# Patient Record
Sex: Female | Born: 1980 | Race: White | Hispanic: No | Marital: Married | Smoking: Never smoker
Health system: Southern US, Community
[De-identification: ages and names within clinical notes are randomized; demographics above are authoritative.]

## PROBLEM LIST (undated history)

## (undated) DIAGNOSIS — I493 Ventricular premature depolarization: Secondary | ICD-10-CM

## (undated) DIAGNOSIS — N3020 Other chronic cystitis without hematuria: Secondary | ICD-10-CM

## (undated) DIAGNOSIS — G43909 Migraine, unspecified, not intractable, without status migrainosus: Secondary | ICD-10-CM

## (undated) DIAGNOSIS — R002 Palpitations: Secondary | ICD-10-CM

## (undated) DIAGNOSIS — N390 Urinary tract infection, site not specified: Secondary | ICD-10-CM

## (undated) DIAGNOSIS — K219 Gastro-esophageal reflux disease without esophagitis: Secondary | ICD-10-CM

## (undated) DIAGNOSIS — F410 Panic disorder [episodic paroxysmal anxiety] without agoraphobia: Secondary | ICD-10-CM

## (undated) DIAGNOSIS — Z87898 Personal history of other specified conditions: Secondary | ICD-10-CM

## (undated) HISTORY — PX: TUBAL LIGATION: SHX77

## (undated) HISTORY — PX: TONSILLECTOMY: SUR1361

## (undated) HISTORY — PX: BLADDER SURGERY: SHX569

## (undated) HISTORY — PX: LUMBAR FUSION: SHX111

---

## 2005-05-07 ENCOUNTER — Ambulatory Visit: Payer: Self-pay | Admitting: Otolaryngology

## 2006-01-14 ENCOUNTER — Observation Stay: Payer: Self-pay | Admitting: Obstetrics & Gynecology

## 2006-01-15 ENCOUNTER — Observation Stay: Payer: Self-pay

## 2006-03-08 ENCOUNTER — Observation Stay: Payer: Self-pay

## 2006-03-30 ENCOUNTER — Inpatient Hospital Stay: Payer: Self-pay

## 2008-05-16 ENCOUNTER — Emergency Department: Payer: Self-pay | Admitting: Emergency Medicine

## 2008-09-13 ENCOUNTER — Emergency Department: Payer: Self-pay | Admitting: Emergency Medicine

## 2009-01-30 ENCOUNTER — Ambulatory Visit: Payer: Self-pay | Admitting: Unknown Physician Specialty

## 2009-04-14 HISTORY — PX: SPINAL FUSION: SHX223

## 2009-05-02 ENCOUNTER — Emergency Department: Payer: Self-pay | Admitting: Emergency Medicine

## 2009-08-09 ENCOUNTER — Ambulatory Visit: Payer: Self-pay | Admitting: Unknown Physician Specialty

## 2009-10-04 ENCOUNTER — Encounter: Admission: RE | Admit: 2009-10-04 | Discharge: 2009-10-04 | Payer: Self-pay | Admitting: Unknown Physician Specialty

## 2009-11-29 ENCOUNTER — Ambulatory Visit: Payer: Self-pay | Admitting: Unknown Physician Specialty

## 2009-12-04 ENCOUNTER — Inpatient Hospital Stay: Payer: Self-pay | Admitting: Unknown Physician Specialty

## 2011-07-16 ENCOUNTER — Ambulatory Visit: Payer: Self-pay

## 2011-09-29 ENCOUNTER — Ambulatory Visit: Payer: Self-pay

## 2011-09-29 LAB — HEMATOCRIT: HCT: 41.9 %

## 2011-09-29 LAB — PREGNANCY, URINE: Pregnancy Test, Urine: NEGATIVE m[IU]/mL

## 2011-10-03 ENCOUNTER — Ambulatory Visit: Payer: Self-pay

## 2011-12-13 ENCOUNTER — Emergency Department: Payer: Self-pay | Admitting: Emergency Medicine

## 2011-12-15 ENCOUNTER — Emergency Department: Payer: Self-pay | Admitting: Emergency Medicine

## 2011-12-20 ENCOUNTER — Emergency Department: Payer: Self-pay | Admitting: Emergency Medicine

## 2011-12-26 ENCOUNTER — Observation Stay: Payer: Self-pay | Admitting: Internal Medicine

## 2011-12-26 LAB — CSF CELL CT + PROT + GLU PANEL
CSF Tube #: 3
Lymphocytes: 77 %
Neutrophils: 3 %
Other Cells: 0 %
RBC (CSF): 0 /mm3
WBC (CSF): 12 /mm3

## 2011-12-26 LAB — COMPREHENSIVE METABOLIC PANEL
Alkaline Phosphatase: 64 U/L (ref 50–136)
Calcium, Total: 9.4 mg/dL (ref 8.5–10.1)
Chloride: 105 mmol/L (ref 98–107)
Co2: 27 mmol/L (ref 21–32)
EGFR (African American): >60
EGFR (Non-African Amer.): >60
SGPT (ALT): 21 U/L (ref 12–78)

## 2011-12-26 LAB — CBC WITH DIFFERENTIAL/PLATELET
Basophil #: 0 10*3/uL (ref 0.0–0.1)
Eosinophil #: 0 10*3/uL (ref 0.0–0.7)
Lymphocyte %: 7.2 %
MCHC: 33.4 g/dL (ref 32.0–36.0)
Neutrophil #: 6.1 10*3/uL (ref 1.4–6.5)
Neutrophil %: 89.1 %
Platelet: 156 10*3/uL (ref 150–440)
RDW: 12.1 % (ref 11.5–14.5)

## 2011-12-26 LAB — URINALYSIS, COMPLETE
Bacteria: NONE SEEN
Glucose,UR: NEGATIVE mg/dL (ref 0–75)
Squamous Epithelial: 7

## 2011-12-27 LAB — CBC WITH DIFFERENTIAL/PLATELET
Basophil #: 0 10*3/uL (ref 0.0–0.1)
Basophil %: 0.6 %
Eosinophil #: 0.1 10*3/uL (ref 0.0–0.7)
Eosinophil %: 2.6 %
HCT: 33.4 % — ABNORMAL LOW (ref 35.0–47.0)
HGB: 11.3 g/dL — ABNORMAL LOW (ref 12.0–16.0)
Lymphocyte #: 1.3 10*3/uL (ref 1.0–3.6)
Lymphocyte %: 38.5 %
MCH: 30.9 pg (ref 26.0–34.0)
MCHC: 33.8 g/dL (ref 32.0–36.0)
MCV: 91 fL (ref 80–100)
Monocyte #: 0.4 x10 3/mm (ref 0.2–0.9)
Monocyte %: 11.6 %
Neutrophil #: 1.6 10*3/uL (ref 1.4–6.5)
Neutrophil %: 46.7 %
Platelet: 124 10*3/uL — ABNORMAL LOW (ref 150–440)
RBC: 3.66 10*6/uL — ABNORMAL LOW (ref 3.80–5.20)
RDW: 12.3 % (ref 11.5–14.5)
WBC: 3.5 10*3/uL — ABNORMAL LOW (ref 3.6–11.0)

## 2011-12-27 LAB — COMPREHENSIVE METABOLIC PANEL
Albumin: 2.5 g/dL — ABNORMAL LOW (ref 3.4–5.0)
Alkaline Phosphatase: 47 U/L — ABNORMAL LOW (ref 50–136)
Anion Gap: 8 (ref 7–16)
BUN: 10 mg/dL (ref 7–18)
Bilirubin,Total: 0.2 mg/dL (ref 0.2–1.0)
Calcium, Total: 7.9 mg/dL — ABNORMAL LOW (ref 8.5–10.1)
Chloride: 111 mmol/L — ABNORMAL HIGH (ref 98–107)
Co2: 24 mmol/L (ref 21–32)
Creatinine: 0.78 mg/dL (ref 0.60–1.30)
EGFR (African American): >60
EGFR (Non-African Amer.): >60
Glucose: 68 mg/dL (ref 65–99)
Osmolality: 282 (ref 275–301)
Potassium: 4 mmol/L (ref 3.5–5.1)
SGOT(AST): 16 U/L (ref 15–37)
SGPT (ALT): 47 U/L (ref 12–78)
Sodium: 143 mmol/L (ref 136–145)
Total Protein: 5.2 g/dL — ABNORMAL LOW (ref 6.4–8.2)

## 2011-12-27 LAB — URINE CULTURE

## 2011-12-28 LAB — CBC WITH DIFFERENTIAL/PLATELET
Basophil #: 0 10*3/uL (ref 0.0–0.1)
Eosinophil #: 0.1 10*3/uL (ref 0.0–0.7)
Eosinophil %: 2.6 %
HGB: 13 g/dL (ref 12.0–16.0)
Lymphocyte #: 1.3 10*3/uL (ref 1.0–3.6)
Lymphocyte %: 30.3 %
Monocyte #: 0.6 x10 3/mm (ref 0.2–0.9)
Monocyte %: 12.9 %
Neutrophil %: 53.4 %
Platelet: 160 10*3/uL (ref 150–440)
RBC: 4.14 10*6/uL (ref 3.80–5.20)
WBC: 4.3 10*3/uL (ref 3.6–11.0)

## 2012-03-02 ENCOUNTER — Ambulatory Visit: Payer: Self-pay | Admitting: Neurology

## 2012-09-07 DIAGNOSIS — N3020 Other chronic cystitis without hematuria: Secondary | ICD-10-CM | POA: Insufficient documentation

## 2013-07-21 ENCOUNTER — Ambulatory Visit: Payer: Self-pay

## 2014-07-26 DIAGNOSIS — F419 Anxiety disorder, unspecified: Secondary | ICD-10-CM | POA: Insufficient documentation

## 2014-07-26 DIAGNOSIS — G43909 Migraine, unspecified, not intractable, without status migrainosus: Secondary | ICD-10-CM | POA: Insufficient documentation

## 2014-08-01 NOTE — Consult Note (Signed)
PATIENT NAME:  Nicole, Yu MR#:  [id number] DATE OF BIRTH:  Oct 09, 1980  DATE OF CONSULTATION:  12/26/2011  REFERRING PHYSICIAN:  Ceasar Lund. Anselm Jungling, MD   CONSULTING PHYSICIAN:  Heinz Knuckles. Jayce Kainz, MD  REASON FOR CONSULTATION: Fever and headache.   HISTORY OF PRESENT ILLNESS: The patient is a 34 year old white female with a past history significant for lumbar spine surgery who was admitted today with approximately 4 to 5 days of headache, photophobia, and worsening fever. The patient was in her usual state of health until the Sunday before admission when she began having headache. She took over-the-counter medications. She describes most of the headache as being somewhat frontal. She thought it might be involving her sinuses, so in addition to Tylenol she was taking over-the-counter decongestants with some relief. On the day prior to admission, she began having increased headache and photophobia. She also began having high spiking temperatures and chills and sweats. She also had some stiff neck. She was brought to the hospital last night and was subsequently admitted early this morning. She was given a dose of doxycycline in the ER but has not received any other antibiotics since that time. She had a normal white count on admission. She underwent lumbar puncture today which had 0 red cells, 12 white cells, 77% of which were lymphocytes. She denies any rashes. She has not had any respiratory symptoms. She has had some intermittent loose stool which is not atypical for her. She does report having had several ticks removed from her on the day of the onset of her symptoms. She was out tracking a deer with her husband. She does not recall other tick exposures in the last six months but is outside often and works with animals. 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 The dog was given vaccines and later developed neurologic symptoms. He  was brought back to the vet, and during one of these visits the patient was scratched but not bitten by the dog. When the dog began having worsening neurologic symptoms, it was given treatment for a vaccine reaction. Over the next few days, the dog was shot and the left at the side of the road. This was not learned by that veterinarian office until  two days later, and at that point the dog was not able to be evaluated for the possibility of rabies. This exposure occurred 12/09/2011. The patient received immune globulin and rabies vaccine and was scheduled to receive her last rabies vaccine tomorrow.   ALLERGIES: Amoxicillin, Mobic, penicillin, eggs and doxycycline (which she states causes stomach upset although she tolerated the dose yesterday without problem).   PAST MEDICAL HISTORY:  1. 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  2. Panic attack. This occurred once over a year ago and has  not recurred.  3. L4-L5 fusion.  4. Bilateral tubal ligation.   SOCIAL HISTORY: The patient works in a Chief Technology Officer. She is exposed to multiple animals. She does not smoke. She drinks very rarely. She lives with her husband and two children. She does hunt.   FAMILY HISTORY: Positive for coronary artery disease, breast cancer, and ankylosing spondylitis.    REVIEW OF SYSTEMS: GENERAL: Positive fevers, chills, sweats, malaise. HEENT: Positive headache that has been fairly severe. Positive photophobia. Some sinus congestion. No sore throat. NECK: Positive stiffness. No swollen glands. RESPIRATORY: No cough, no shortness of breath. No sputum production. CARDIAC:  No chest pains or palpitations. GI: No nausea, no vomiting, no abdominal pain. She has had some loose stool occasionally. GENITOURINARY: No change in her urine. MUSCULOSKELETAL: She has had myalgias but no frank arthritis. SKIN: No rashes. NEUROLOGIC: No focal weakness. No agitation. No somnolence. PSYCHIATRIC: No complaints. All  other systems are negative.   PHYSICAL EXAMINATION:  VITAL SIGNS: T-max 97.9 in the hospital, although it is reported in the History and Physical that she had a fever of up to 102.8 in the Emergency Room. T-current is 96.8, pulse 80, blood pressure 106/68, 96% on room air.   GENERAL: A 34 year old white female in no acute distress but uncomfortable in a darkened room.   HEENT: Normocephalic, atraumatic. Pupils are equal, reactive to light. Extraocular motion intact. Sclerae, conjunctivae, and lids are without evidence for emboli or petechiae. Oropharynx shows no erythema or exudate. Teeth and gums are in good condition. Sinuses are without focal tenderness.   NECK: Midline trachea. No lymphadenopathy. No thyromegaly. She had neck stiffness and had discomfort with moving her neck into forward flexion.   CHEST: Clear to auscultation bilaterally with good air movement. No focal consolidation.  HEART: Regular rate and rhythm without murmur, rub, or gallop.   ABDOMEN: Soft, nontender, and nondistended. No hepatosplenomegaly. No hernia is noted.   EXTREMITIES: No evidence for tenosynovitis.   SKIN: No rashes. No stigmata of endocarditis, specifically no Janeway lesions nor Osler nodes.   NEUROLOGIC: The patient was awake and interactive. She was mentating well. She appeared uncomfortable but was not lethargic. She was able to provide a good history. She was moving all four extremities. She did have neck stiffness as well as evident photophobia when the lights were turned on. She had positive Kernig and Brudzinski sign present.   PSYCHIATRIC: Mood and affect appeared normal.   LABORATORY, DIAGNOSTIC AND RADIOLOGICAL DATA: BUN 12, creatinine 0.86, bicarbonate 27, anion gap of 9.0. LFTs were within normal limits. White count 6.8, hemoglobin 13.5, platelet count 156, ANC 6.1 CSF tube three showed 0 red cells, 12 white cells, 3% neutrophils, 77% lymphocytes, glucose was 59, protein was 30, rapid  influenza testing was negative. CSF Gram stain shows rare white cells, no red cells, no organisms seen. Cultures are pending. Urinalysis was unremarkable other than 1+ blood, lactic acid level was 0.8. A chest x-ray showed no acute infiltrates. A CT scan of the head without contrast showed no acute intracranial process.   IMPRESSION: A 34 year old white female with a history of lumbar back surgery admitted with fever, headaches, and photophobia.   RECOMMENDATIONS:  1. She has some meningeal signs but her white blood cells and her CSF are only mildly elevated. She has had tick exposure and RMSF or Ehrlichia are possible etiologies. There is no confusion to suggest encephalitis. She had an exposure to a dog who subsequently developed neurologic symptoms but was not able to be evaluated for rabies. Her exposure was a scratch rather than a bright, and she has been given rabies prophylaxis. Her symptoms are not currently suggestive of human rabies.  2. She has received one dose of doxycycline in the ER but has not received any more. She appeared to tolerate this well. We will start her on a seven-day course of therapy. If she develops GI side effects, would treat the symptoms and continue the doxycycline.  3. We will add an RMSF IgG to the current labs already pending. Several other serologies for Ehrlichia and RMSF are pending.  4. If she improves,  I would DC her on doxycycline, and I will be happy to see her next week in the office.  5. If her headache persists but her other symptoms improve, I would consider the possibility of a CSF leak.  6. If she becomes more somnolent or significantly agitated, we will need to consider rabies more closely and perform a nuchal fold biopsy and salivary PCR for rabies virus. No specific therapy would be available.   This is a highly complex Infectious Disease consult.   Thank you very much for involving me in Nicole Yu care.   ____________________________ Heinz Knuckles. Chetan Mehring, MD meb:cbb D: 12/26/2011 16:24:24 ET T: 12/26/2011 17:00:12 ET JOB#: [id number]  cc: Heinz Knuckles. Kala Ambriz, MD, <Dictator> Purl Claytor E Costella Schwarz MD ELECTRONICALLY SIGNED 12/29/2011 8:38

## 2014-08-01 NOTE — Consult Note (Signed)
Impression: 34yo WF w/ h/o lumbar back surgery admitted with fever, headache and photophobia.  She has some meningeal signs, but her WBCs in her CSF are only mildly elevated.  She has had tick exposure and RMSF or Ehrlichia are possible etiologies.  There no confusion to suggest encephalitis.  She had an exposure to a dog who subsequently developed neurologic symptoms, but was not able to be evaluated for rabies.  Her exposure was a scratch rather than a bite and she has been given rabies prophylaxis.  Her symptoms are not currently suggestive of human rabies. She received one dose of doxycyline in the ER, but has not received any more.  Will start her on a 7 day course of therapy. Will add RMSF IgG to her labs.  Other serologies are pending. If she improves, would d/c her on doxycycline and I will see her next week in the office. If her headache persists, but her other symptoms improve, will consider possibility of a CSF leak. If she becomes more somnolent or significantly agitated, will need to consider rabies more closely and perform a nuchal fold biopsy and salivary PCR.  No specific therapy would be available.  Electronic Signatures: Kanetra Ho, Heinz Knuckles (MD)  (Signed on 13-Sep-13 16:04)  Authored  Last Updated: 13-Sep-13 16:04 by Amed Datta, Heinz Knuckles (MD)

## 2014-08-01 NOTE — Discharge Summary (Signed)
PATIENT NAME:  Nicole Yu, Nicole Yu MR#:  [id number] DATE OF BIRTH:  1980/09/20  DATE OF ADMISSION:  12/26/2011 DATE OF DISCHARGE:  12/28/2011  PRIMARY CARE PHYSICIAN: Maurine Minister, NP  CONSULTATION: Dr. Clayborn Bigness   FINAL DIAGNOSIS: Fever of unknown origin, possible viral meningitis.   CONDITION: Stable.   CODE STATUS: FULL CODE.   HOME MEDICATIONS:  1. Norco 325 mg/10 mg p.o. tablets every six hours p.r.n. for three days.  2. Doxycycline 100 mg p.o. daily q.12 hours for five days.   3. Zofran 4 mg p.o. every eight hours for five days p.r.n.  4. Stop medication: Tramadol 50 mg p.o. q.4 hours p.r.n.    DIET: Regular diet.   ACTIVITY: As tolerated.   FOLLOW UP CARE: Follow up PCP within one week. Follow up with Dr. Clayborn Bigness within one week.   REASON FOR ADMISSION: Headache and fever.   HISTORY OF PRESENT ILLNESS: Patient is a 34 year old Caucasian female with history of recurrent urinary tract infections developed sudden onset of headache which was very severe, 9/10. In addition patient complains of neck pain and photophobia. Patient was noted to have fever of 102.8 in the hospital. She has history of going out trying to catch a deer and removed two ticks from her leg. In addition, patient has a history of being scratched by a dog at her work. She works in an Technical brewer for mostly pets. For history and physical examination please refer to the admission note dictated by Dr. Laurin Coder.   LABORATORY, DIAGNOSTIC AND RADIOLOGICAL DATA: Laboratory data on admission glucose 111, BUN 12, creatinine 0.86, potassium 3.8. Liver functions normal, white count 6.8, hemoglobin 13.5, platelets 156. Urinalysis negative. Lactic acid 0.8.   HOSPITAL COURSE: Patient was admitted for fever of unknown origin and headache. Differential diagnosis includes ehrlichiosis, [facility] spotted fever, typhus, Lyme disease. After admission patient has been treated with pain control and doxycycline 100 mg p.o. every 12  hours. Patient underwent lumbar puncture, however, cerebrospinal fluid test is negative. According to Dr. Clayborn Bigness patient needs total seven days of doxycycline and follow up with him as outpatient. So far work-up is negative. Patient still has a headache but is better. No other symptoms. Physical examination is unremarkable. No focal deficit. Patient is clinically stable. Will be discharged home today. Discussed the patient's discharge plan with patient.   TIME SPENT: About 35 minutes.  ____________________________ Demetrios Loll, MD qc:cms D: 12/28/2011 14:10:42 ET T: 12/29/2011 09:07:15 ET JOB#: [id number]  cc: Demetrios Loll, MD, <Dictator> Meredith Leeds, NP Demetrios Loll MD ELECTRONICALLY SIGNED 12/29/2011 16:05

## 2014-08-01 NOTE — H&P (Signed)
PATIENT NAME:  Nicole Yu, Nicole Yu MR#:  [id number] DATE OF BIRTH:  Jan 08, 1981  DATE OF ADMISSION:  12/26/2011  PRIMARY CARE PHYSICIAN: Maurine Minister, NP  REASON FOR ADMISSION: Headache and fever.   HISTORY OF PRESENT ILLNESS: Nicole Yu is a very nice 34 year old female who is overall healthy other than having recurrent UTIs. She developed sudden onset of headache on Sunday which was very severe, 9 to 10 out of 10 and it has been continuous and not getting better since then. The patient states that the headache is located all around her head, diffuse, throbbing with intensity of 9 out of 10 for the most and aggravated by movement, loud sounds, and lights. It gets better in the dark and whenever she rests. This has also been associated with neck pain and some type of neck stiffness. For the past four to five days the headache has remained steady and it has not gotten any better. The patient states that she has never had migraine headaches or any other type of headaches and never felt this way before. Today she started developing nausea, arthralgias, and fever. Her fever was documented as 102.8 here in the hospital and decreased to 99.2 after medications like Tylenol were given. The patient states that the nausea has been significant through all day, but she has not had any vomiting. She also feels like her heart is palpitating really fast. She states that her neck and wrist feel very tender, but she has muscle like pains around the calf, thighs and lower back. The patient feels like she has been working out really hard and today she is sore after a workout. The patient had some Sudafed to help the headache and she states that she has been sneezing a lot. She has a history of going out to try to catch a deer last weekend and she removed two ticks out of her legs, one on her left foot and the other one on her right posterior thigh. They were attached but she states that they were not attached for too long. She also  has a history of being scratched by a dog at her work. She works in an Technical brewer for mostly pets. The dog who scratched her was suspicions for rabies and the dog was not put in quarantine and apparently was exhibiting some neurologic symptoms, but the dog died and was never taken for testing. The patient has received rabies prophylaxis treatment and she has gotten the third dose of injection so far.   REVIEW OF SYSTEMS: CONSTITUTIONAL: Positive fever, positive fatigue, positive weakness, positive headache and positive neck pain. No weight loss. No weight gain. EYES: Positive photophobia and some pain behind the eyes due to the headache. Denies any changes in vision, double vision or blurry vision. ENT: Denies any tinnitus. No sore throat. No difficulty swallowing. No postnasal drip. Positive sneezing. RESPIRATORY: No cough. No wheezing. No pneumonias in the past. No dyspnea. CARDIOVASCULAR: No chest pain, orthopnea, or syncope. GASTROINTESTINAL: Positive nausea. No abdominal pain and no hematemesis or melena. No rectal bleeding. No jaundice. GENITOURINARY: Denies any dysuria or hematuria. She does have chronic or recurrent UTIs, but she does not feel like she has one at this moment. She denies any sexually transmitted diseases. She has had two vaginal births. ENDOCRINE: No polyuria, polydipsia, polyphagia, cold or heat intolerance. HEME/LYMPH: Negative for bleeding or easy bruising. SKIN: Negative for rashes or petechiae. MUSCULOSKELETAL: Positive for myalgias and arthralgias. No edema of joints. NEUROLOGIC: Positive for severe headache.  No vertigo. No transient ischemic attacks. No seizures. PSYCH: Negative for bipolar disorder, insomnia, or depression.   PAST MEDICAL HISTORY: Recurrent urinary tract infections.  ALLERGIES: The patient is allergic to amoxicillin, penicillin, Mobic, and eggs. The amoxicillin and penicillin give her rash on the face, the Mobic nausea, and she has also listed doxycycline  as an allergy, although the only reaction she had is just nausea. She does not have a true allergy to doxycycline.   PAST SURGICAL HISTORY:  1. Bladder surgery as a child. 2. Tonsillectomy.  3. Back fusion at the level of L4-L5.  4. Bilateral tubal ligation.  5. Two vaginal deliveries and one miscarriage.   MEDICATIONS: Excedrin Headache and Sudafed.   SOCIAL HISTORY: She denies any tobacco use and never has. She drinks very seldom, once a week socially and she works in an Technical brewer and as mentioned above she has been exposed to a suspected rabid dog.  FAMILY HISTORY: Positive for myocardial infarction in grandfather and uncle and breast cancer in an aunt. No diabetes in the family.   PHYSICAL EXAMINATION:   VITAL SIGNS: Blood pressure 103/51 and pulse 116. On admission pulse was 160 and temperature was 102.8.   GENERAL: The patient is alert and oriented x3. She is in some mild distress due to her headache. She is lying down in a dark room with her eyes closed and a pillow on top of her head. She is not in any respiratory distress at the moment.   HEENT: Her pupils are equal and reactive to light. Extraocular movements are intact. Mucosa is moist. No oropharyngeal exudates. No oral lesions. No postnasal drip. No erythema of the oropharynx. Anicteric sclerae. Pink conjunctivae. Head normocephalic and atraumatic.   NECK: Supple. No JVD. No thyromegaly. No adenopathy. Mild tender to mobilization but Kernig and Brudzinski signs are negative. No carotid bruits. No palpable lymphadenopathy in the neck or supraclavicular areas.   CARDIOVASCULAR: Regular rate and rhythm. No murmurs, rubs, or gallops. Tachycardic. Apical cardiac impulse is nondisplaced.   LUNGS: Clear without any wheezing or crepitus. No dullness to percussion.   ABDOMEN: Soft, nontender, and nondistended. No hepatosplenomegaly. No masses. Bowel sounds are positive. No hepatic stigmata.   GENITALIA: Examination is  deferred.  EXTREMITIES: No edema, no cyanosis, and no clubbing. Normal articulation. No erythema on top of joints.   MUSCULOSKELETAL: No deformity of joints. No tenderness of spinous processes.   NEUROLOGIC: Cranial nerves II through XII are intact. Nonfocal exam.   SKIN: No rashes on the palms or soles. The patient has two insect/tick bite marks on her left foot and posterior thigh. No Target lesions. No erythema around these bites. No palpable nodules on these areas as well.  LYMPHATICS: Negative for lymphadenopathy in neck, supraclavicular, axillary, or epitrochlear.   PSYCH: Mood is normal without any signs of depression.  LABORATORY AND RADIOLOGIC DATA: Glucose 111, BUN 12, creatinine 0.86, and potassium 3.8. LFTs are within normal limits. White count 6.8, hemoglobin 13.5, and platelets 156.   Urinalysis: 4 white blood cells and negative for nitrites or leukocyte esterase.   Lactic acid is 0.8.   Chest x-ray is without any infiltrates or consolidations.   ASSESSMENT AND PLAN: A 34 year old female with history of headache for 4 or 5 days and development of fever for one day, significant nausea, arthralgias, and rash, possible tick born illness.   1. Fever of unknown origin. The patient is admitted with significant fever, no source is identified at the moment. As  mentioned above, negative chest x-ray and negative urinalysis. Blood cultures have been taken. White count is normal. No significant abnormalities on blood work, no liver problems. 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 Due to the suspicion of this, we are going to start treatment with doxycycline. The differential diagnosis includes Ehrlichiosis, [facility] Spotted Fever, and typhus. There is also always the possibility of Lyme disease, although the clinical evidence does not correlate much to it. But again she is going to be treated with doxycycline  anyway. The patient does not have a true allergy to doxycycline. Rickettsia panel and [facility] Spotted Fever panel have been sent as well as Ehrlichia immunology. I am going to add influenza A and B antigen to rule out the possibility of having the flu. We are going to consult Infectious Disease and at this moment, since the patient does not have any neurologic symptoms other than the headache and no Kernig or Brudzinski but significant neck pain, we are going to hold on lumbar puncture. The patient and I had a long talk that she might need one. The patient wants not to pursue this at this moment. The ER physician has already talked to her about the risks and benefits and also about the fact that it might not change any of the approach to treatment right now. We are going to continue to monitor her vitals and give support with IV fluids and medications for the headache and fever.  2. Tachycardia. Likely due to fever and mild dehydration.  3. Mild dehydration. Due to the fever. The patient is to receive IV fluids.  4. Headache. Continue treatment with Percocet and Dilaudid if needed.  5. Possible exposure to rabies. Not likely to be rabies due to fast progression, but the patient has received rabies prophylaxis already.    6. Deep vein thrombosis prophylaxis with TED hose.   TIME SPENT: I spent about 50 minutes with this patient. ____________________________ Carnegie Sink, MD rsg:slb D: 12/26/2011 07:14:00 ET T: 12/26/2011 09:26:17 ET JOB#: [id number]  cc: Francis Sink, MD, <Dictator> Meredith Leeds, NP Cristi Loron MD ELECTRONICALLY SIGNED 12/27/2011 15:16

## 2014-08-06 NOTE — Op Note (Signed)
PATIENT NAME:  Nicole Yu, Nicole Yu MR#:  [id number] DATE OF BIRTH:  1981-04-12  DATE OF PROCEDURE:  10/03/2011  PREOPERATIVE DIAGNOSES:  1. Desires sterilization.  2. Desires removal of intrauterine device.   POSTOPERATIVE DIAGNOSES:  1. Desires sterilization.  2. Desires removal of intrauterine device.   OPERATION PERFORMED: Removal of intrauterine device, laparoscopic tubal bilateral cautery and division.   SURGEON: Wonda Cheng. Laurey Morale, M.D.   OPERATIVE FINDINGS: Normal pelvis.   DESCRIPTION OF PROCEDURE: After adequate general anesthesia, the patient was prepped and draped in routine fashion. An infraumbilical incision was made through the skin and approximately 3 liters of carbon dioxide were insufflated without incident. During insufflation the IUD was removed. The uterine manipulator was placed and the bladder was drained. The laparoscope was inserted. Totally normal pelvis was appreciated. The right tube was grasped and after adequate identification of the right fimbria, the right tube was coagulated with bipolar forceps. Coagulation was continued until the current meter read zero. Several additional portions were cauterized on the right side.  A like procedure was performed on the other side. Both areas then were cut with the cutting forceps. All areas of surgery were inspected and found to be hemostatic. The liver was visualized and appeared normal. The appendix was visualized and appeared normal. CO2  was allowed to escape. The skin was closed in routine fashion. The patient tolerated the procedure well and left the operating room in good condition. Sponge and needle counts were said to be correct at the end of the procedure. Estimated blood loss was 2 mL.   ____________________________ Wonda Cheng. Laurey Morale, MD pjr:bjt D:  10/03/2011 10:30:26 ET         T: 10/03/2011 13:11:07 ET         JOB#: [id number]  [name] MD ELECTRONICALLY SIGNED 10/04/2011 6:24

## 2014-09-05 ENCOUNTER — Ambulatory Visit: Payer: No Typology Code available for payment source | Attending: Neurology

## 2014-09-05 DIAGNOSIS — G4711 Idiopathic hypersomnia with long sleep time: Secondary | ICD-10-CM | POA: Insufficient documentation

## 2014-09-05 DIAGNOSIS — R0683 Snoring: Secondary | ICD-10-CM | POA: Diagnosis present

## 2014-09-06 ENCOUNTER — Ambulatory Visit: Payer: No Typology Code available for payment source | Attending: Neurology

## 2014-09-06 DIAGNOSIS — G4711 Idiopathic hypersomnia with long sleep time: Secondary | ICD-10-CM | POA: Diagnosis not present

## 2015-05-07 DIAGNOSIS — N390 Urinary tract infection, site not specified: Secondary | ICD-10-CM | POA: Insufficient documentation

## 2015-05-07 DIAGNOSIS — R300 Dysuria: Secondary | ICD-10-CM | POA: Insufficient documentation

## 2015-06-28 DIAGNOSIS — N1370 Vesicoureteral-reflux, unspecified: Secondary | ICD-10-CM | POA: Insufficient documentation

## 2015-07-02 DIAGNOSIS — N3592 Unspecified urethral stricture, female: Secondary | ICD-10-CM | POA: Insufficient documentation

## 2016-04-24 ENCOUNTER — Ambulatory Visit
Admission: EM | Admit: 2016-04-24 | Discharge: 2016-04-24 | Disposition: A | Discharge status: Home / Self Care | Payer: Managed Care, Other (non HMO) | Attending: Family Medicine | Admitting: Family Medicine

## 2016-04-24 DIAGNOSIS — Z8661 Personal history of infections of the central nervous system: Secondary | ICD-10-CM | POA: Diagnosis not present

## 2016-04-24 DIAGNOSIS — R110 Nausea: Secondary | ICD-10-CM | POA: Diagnosis not present

## 2016-04-24 DIAGNOSIS — Z9889 Other specified postprocedural states: Secondary | ICD-10-CM | POA: Insufficient documentation

## 2016-04-24 DIAGNOSIS — R002 Palpitations: Secondary | ICD-10-CM | POA: Insufficient documentation

## 2016-04-24 DIAGNOSIS — Z79899 Other long term (current) drug therapy: Secondary | ICD-10-CM | POA: Diagnosis not present

## 2016-04-24 DIAGNOSIS — Z880 Allergy status to penicillin: Secondary | ICD-10-CM | POA: Insufficient documentation

## 2016-04-24 DIAGNOSIS — F410 Panic disorder [episodic paroxysmal anxiety] without agoraphobia: Secondary | ICD-10-CM | POA: Diagnosis not present

## 2016-04-24 DIAGNOSIS — Z981 Arthrodesis status: Secondary | ICD-10-CM | POA: Diagnosis not present

## 2016-04-24 DIAGNOSIS — R51 Headache: Secondary | ICD-10-CM | POA: Insufficient documentation

## 2016-04-24 DIAGNOSIS — R519 Headache, unspecified: Secondary | ICD-10-CM

## 2016-04-24 DIAGNOSIS — Z8659 Personal history of other mental and behavioral disorders: Secondary | ICD-10-CM

## 2016-04-24 HISTORY — DX: Migraine, unspecified, not intractable, without status migrainosus: G43.909

## 2016-04-24 HISTORY — DX: Panic disorder (episodic paroxysmal anxiety): F41.0

## 2016-04-24 LAB — RAPID INFLUENZA A&B ANTIGENS
Influenza A (ARMC): NEGATIVE
Influenza B (ARMC): NEGATIVE

## 2016-04-24 MED ORDER — SUMATRIPTAN SUCCINATE 100 MG PO TABS: 100.0000 mg | ORAL_TABLET | ORAL | 0 refills | Status: DC | PRN

## 2016-04-24 MED ORDER — ONDANSETRON 8 MG PO TBDP
8.0000 mg | ORAL_TABLET | Freq: Once | ORAL | Status: AC
  Administered 2016-04-24: 8 mg via ORAL

## 2016-04-24 MED ORDER — ONDANSETRON 8 MG PO TBDP: 8.0000 mg | ORAL_TABLET | Freq: Three times a day (TID) | ORAL | 0 refills | Status: DC | PRN

## 2016-04-24 MED ORDER — SUMATRIPTAN SUCCINATE 6 MG/0.5ML ~~LOC~~ SOLN
6.0000 mg | Freq: Once | SUBCUTANEOUS | Status: AC
  Administered 2016-04-24: 6 mg via SUBCUTANEOUS

## 2016-04-24 MED ORDER — METHYLPREDNISOLONE SODIUM SUCC 125 MG IJ SOLR
125.0000 mg | Freq: Once | INTRAMUSCULAR | Status: AC
  Administered 2016-04-24: 125 mg via INTRAMUSCULAR

## 2016-04-24 NOTE — ED Triage Notes (Signed)
Pt has had sx x a week. Has seen her Dr. For same. Has hx of vestibular migraines. Her Dr. Treated her with Prednisone taper. Today feels like her heart is racing and feels lightheaded. Started the Prednisone today and wonders if this is a side effect. Has had panic attacks in the past and states this may also be a panic attack. Headache pain remains 7/10.

## 2016-04-24 NOTE — ED Provider Notes (Signed)
[facility]    CSN: [id number] Arrival date & time: 04/24/16  1453     History   Chief Complaint Chief Complaint  Patient presents with  . Headache  . Palpitations    HPI Nicole Yu is a 36 y.o. female.   Patient's here with a very extensive history. She reports last Thursday through Monday she had a atypical headache. She's had recurrent migraine headaches since she had aseptic meningitis several years ago. She states this headache is different from her usual headaches and that is all over her body all over her head in the frontal area on the temporal area and the back of her head and neck. She was seen by her neurologist on Monday placed on a decreasing prednisone dose pack to see if that will make a difference. She states that she was unable to get the prednisone Monday night because of pharmacy closed Tuesday Wednesday was other issues should not get the prednisone either. She finally to the prednisone today but today was also the day when the headache out a lot worse she felt a lot worse. She became nauseous maybe thrown up once but her coworkers felt she needed to leave they stated to her that she looked green she looks sick and she looked like she was going to pass out. She also states that she started becoming nervous her heart started racing and she had what she felt was a full panic attack. The difference though was that well with his panic attack she had palpitations which she's had palpitations before but they do not usually last like this. Because of all the frustration and the worsening of her condition she came in to be seen and evaluated. She knows that I'm not neurologist. She has had imaging studies before placement a few years since the last one but she is under a neurologist care as well as a PCP care she has had back surgery before and she's had tubal ligation, spinal fusion, tonsillectomy and bladder surgery before in the past. She does not smoke and she is  allergic to doxycycline penicillins. There is no pertinent family medical history relevant to today's visit known the family with migraines headaches like she has. He denies any chest pain or shortness of breath.    The history is provided by the patient. No language interpreter was used.  Headache  Pain location:  Generalized Radiates to:  Does not radiate Onset quality:  Sudden Timing:  Constant Progression:  Worsening Chronicity:  New Similar to prior headaches: no   Relieved by:  Nothing Worsened by:  Nothing Ineffective treatments:  None tried Associated symptoms: congestion, facial pain, nausea, vomiting and weakness   Nausea:    Severity:  Moderate   Onset quality:  Sudden Palpitations  Associated symptoms: nausea, vomiting and weakness   Associated symptoms: no chest pain and no shortness of breath     Past Medical History:  Diagnosis Date  . Migraines   . Panic attacks     There are no active problems to display for this patient.   Past Surgical History:  Procedure Laterality Date  . BLADDER SURGERY    . SPINAL FUSION    . TONSILLECTOMY    . TUBAL LIGATION      OB History    No data available       Home Medications    Prior to Admission medications   Medication Sig Start Date End Date Taking? Authorizing Provider  meclizine Johnathan Hausen)  12.5 MG tablet Take 12.5 mg by mouth 3 (three) times daily as needed for dizziness.   Yes Historical Provider, MD  predniSONE (STERAPRED UNI-PAK 21 TAB) 10 MG (21) TBPK tablet Take 10 mg by mouth daily.   Yes Historical Provider, MD  ondansetron (ZOFRAN ODT) 8 MG disintegrating tablet Take 1 tablet (8 mg total) by mouth every 8 (eight) hours as needed for nausea or vomiting. 04/24/16   Frederich Cha, MD    Family History History reviewed. No pertinent family history.  Social History Social History  Substance Use Topics  . Smoking status: Never Smoker  . Smokeless tobacco: Never Used  . Alcohol use Yes     Comment:  social     Allergies   Doxycycline and Penicillins   Review of Systems Review of Systems  HENT: Positive for congestion.   Respiratory: Negative for chest tightness, shortness of breath and wheezing.   Cardiovascular: Positive for palpitations. Negative for chest pain.  Gastrointestinal: Positive for nausea and vomiting.  Neurological: Positive for weakness and headaches.  Psychiatric/Behavioral: The patient is nervous/anxious.   All other systems reviewed and are negative.    Physical Exam Triage Vital Signs ED Triage Vitals  Enc Vitals Group     BP --      Pulse --      Resp --      Temp --      Temp src --      SpO2 --      Weight 04/24/16 1615 150 lb (68 kg)     Height 04/24/16 1615 5\' 3"  (1.6 m)     Head Circumference --      Peak Flow --      Pain Score 04/24/16 1620 7     Pain Loc --      Pain Edu? --      Excl. in [place]? --    No data found.   Updated Vital Signs Ht 5\' 3"  (1.6 m)   Wt 150 lb (68 kg)   LMP 04/17/2016   BMI 26.57 kg/m   Visual Acuity Right Eye Distance:   Left Eye Distance:   Bilateral Distance:    Right Eye Near:   Left Eye Near:    Bilateral Near:     Physical Exam  Constitutional: She is oriented to person, place, and time. She appears well-developed and well-nourished. No distress.  HENT:  Head: Normocephalic and atraumatic.  Right Ear: External ear normal.  Left Ear: External ear normal.  Nose: Nose normal.  Mouth/Throat: Oropharynx is clear and moist.  Eyes: EOM are normal. Pupils are equal, round, and reactive to light.  Neck: Normal range of motion. No thyromegaly present.  Cardiovascular: Normal rate, regular rhythm and normal heart sounds.   Pulmonary/Chest: Effort normal.  Musculoskeletal: Normal range of motion. She exhibits no edema or deformity.  Lymphadenopathy:    She has no cervical adenopathy.  Neurological: She is alert and oriented to person, place, and time.  Skin: Skin is warm. No rash noted. She is  not diaphoretic.  Psychiatric: She has a normal mood and affect.  Vitals reviewed.    UC Treatments / Results  Labs (all labs ordered are listed, but only abnormal results are displayed) Labs Reviewed - No data to display  EKG  EKG Interpretation None     ED ECG REPORT I, Kaedyn Belardo H, the attending physician, personally viewed and interpreted this ECG.   Date: 04/24/2016  EKG Time: 16:27:01  Rate:  84  Rhythm: normal EKG, normal sinus rhythm, there are no previous tracings available for comparison  Axis: 31  Intervals:none  ST&T Change: none  Radiology No results found.  Procedures Procedures (including critical care time)  Medications Ordered in UC Medications  methylPREDNISolone sodium succinate (SOLU-MEDROL) 125 mg/2 mL injection 125 mg (not administered)  SUMAtriptan (IMITREX) injection 6 mg (not administered)  ondansetron (ZOFRAN-ODT) disintegrating tablet 8 mg (not administered)     Initial Impression / Assessment and Plan / UC Course  I have reviewed the triage vital signs and the nursing notes.  Pertinent labs & imaging results that were available during my care of the patient were reviewed by me and considered in my medical decision making (see chart for details).  Clinical Course    Patient will be given 0.6 mg of Imitrex Zofran 8 mg ODT and 25 mg of Solu-Medrol. In the injections doesn't help her migraines subcutaneous might make a difference she declined the Toradol since that apparently to her no help her having headache explained to her bike taking the higher dose of Solu-Medrol that might help reduce the inflammation around her head and then I would let her start taking her rest of the prednisone taper starting tomorrow will give a work note for today today and tomorrow as well. Zofran sublingual to use at home for nausea WITH her neurologist or her PCP will allow her neurologist to make a decision with this needs any further imaging studies are  not.  I've explained to her that the EKG is normal sinus rhythm and not for any further workup is needed for the palpitations with anxiety disorder.  Final Clinical Impressions(s) / UC Diagnoses   Final diagnoses:  Palpitations  Bad headache  Personal history of anxiety disorder    New Prescriptions New Prescriptions   ONDANSETRON (ZOFRAN ODT) 8 MG DISINTEGRATING TABLET    Take 1 tablet (8 mg total) by mouth every 8 (eight) hours as needed for nausea or vomiting.     Note: This dictation was prepared with Dragon dictation along with smaller phrase technology. Any transcriptional errors that result from this process are unintentional.    Since the time was spent face-to-face discussing her medical history her reviewing her history and discussing the migraine palpitations and nausea and vomiting. She also now has reported Imitrex does seem to help and will offer to call in a prescription for Imitrex with Zofran and recommend we do do a flu test just make sure there is no signs of fluid either.   Frederich Cha, MD 04/24/16 [phone]

## 2017-02-05 DIAGNOSIS — R002 Palpitations: Secondary | ICD-10-CM | POA: Insufficient documentation

## 2017-02-05 DIAGNOSIS — R42 Dizziness and giddiness: Secondary | ICD-10-CM | POA: Insufficient documentation

## 2017-06-03 DIAGNOSIS — R0789 Other chest pain: Secondary | ICD-10-CM | POA: Insufficient documentation

## 2018-01-05 ENCOUNTER — Other Ambulatory Visit: Payer: Self-pay | Admitting: Family Medicine

## 2018-01-05 DIAGNOSIS — R109 Unspecified abdominal pain: Secondary | ICD-10-CM

## 2018-01-12 ENCOUNTER — Encounter (INDEPENDENT_AMBULATORY_CARE_PROVIDER_SITE_OTHER): Payer: Self-pay

## 2018-01-12 ENCOUNTER — Ambulatory Visit
Admission: RE | Admit: 2018-01-12 | Discharge: 2018-01-12 | Disposition: A | Discharge status: Home / Self Care | Payer: 59 | Source: Ambulatory Visit | Attending: Family Medicine | Admitting: Family Medicine

## 2018-01-12 ENCOUNTER — Ambulatory Visit: Payer: Managed Care, Other (non HMO)

## 2018-01-12 DIAGNOSIS — N270 Small kidney, unilateral: Secondary | ICD-10-CM | POA: Diagnosis not present

## 2018-01-12 DIAGNOSIS — N1330 Unspecified hydronephrosis: Secondary | ICD-10-CM | POA: Diagnosis not present

## 2018-01-12 DIAGNOSIS — R109 Unspecified abdominal pain: Secondary | ICD-10-CM

## 2018-01-12 DIAGNOSIS — N390 Urinary tract infection, site not specified: Secondary | ICD-10-CM | POA: Insufficient documentation

## 2018-01-21 ENCOUNTER — Encounter: Payer: Self-pay | Admitting: Urology

## 2018-01-21 ENCOUNTER — Ambulatory Visit: Payer: 59 | Admitting: Urology

## 2018-01-21 VITALS — BP 123/81 | HR 69 | Ht 63.0 in | Wt 109.9 lb

## 2018-01-21 DIAGNOSIS — Z87442 Personal history of urinary calculi: Secondary | ICD-10-CM | POA: Diagnosis not present

## 2018-01-21 DIAGNOSIS — N1339 Other hydronephrosis: Secondary | ICD-10-CM

## 2018-01-21 DIAGNOSIS — N921 Excessive and frequent menstruation with irregular cycle: Secondary | ICD-10-CM | POA: Insufficient documentation

## 2018-01-21 DIAGNOSIS — Z87448 Personal history of other diseases of urinary system: Secondary | ICD-10-CM

## 2018-01-21 LAB — URINALYSIS, COMPLETE
Bilirubin, UA: NEGATIVE
Glucose, UA: NEGATIVE
LEUKOCYTES UA: NEGATIVE
Nitrite, UA: NEGATIVE
Protein, UA: NEGATIVE
RBC, UA: NEGATIVE
SPEC GRAV UA: 1.02 (ref 1.005–1.030)
Urobilinogen, Ur: 0.2 mg/dL (ref 0.2–1.0)
pH, UA: 6 (ref 5.0–7.5)

## 2018-01-21 LAB — MICROSCOPIC EXAMINATION

## 2018-01-21 NOTE — Progress Notes (Signed)
01/21/2018 10:07 AM   Nicole Yu [birth year] [id number]  Referring provider: Sofie Hartigan, [address], [place] [address]  No chief complaint on file.   HPI: Patient is a 37 year old Caucasian female with a history of vesicoureteral reflux repaired by bilateral ureteral reimplantation in 1986 who is referred by Dr. Thereasa Distance for left hydronephrosis with her mother, Bethena Roys.    She was seen by Dr. Jacqlyn Larsen in 2010 for rUTI's and for dysuria by Dr. Bernardo Heater in 2014.  She was lost to follow up and then returned to [facility]rology in 2017.    CTU in 2017 revealed the kidneys enhance symmetrically and the right kidney appears atrophic relative to the right. Noncontrast evaluation demonstrates several punctate (1-2 mm) nonobstructive calculi within the left kidney.  No focal renal mass lesions are identified.  Excretory phase images demonstrate normal opacification of the renal collecting system and proximal/mid ureters, without focal filling defect.The urinary bladder appears morphologically normal. The opacified portions of the urinary bladder demonstrate no focal filling defect  She underwent urodynamics which noted normal bladder compliance and grade 2 reflux on the right.  Cystoscopy performed by Kieth Brightly PA noted a urethral stricture and numerous areas of irritation.  She was to return in 3 months for repeat cystoscopy, but she was lost to follow up.  She presented to Dr. Jorene Minors office on September 23 with a complaints of dysuria, suprapubic pain and left flank pain.  UA was positive for 4-10 WBC's and rare bacteria.  Her urine culture was negative.   RUS on 01/12/2018 revealed small right kidney. This may be secondary to hyperplasia and or atrophy. Similar finding noted on prior exam.  Moderate left hydronephrosis which improved slightly following voiding.    Today, she is experiencing frequency, urgency, dysuria and incontinence.  Patient denies any gross  hematuria, dysuria or suprapubic/flank pain.  Patient denies any fevers, chills, nausea or vomiting.   Her UA today is positive for moderate bacteria.    She is drinking water like crazy now since January 2019.     PMH: Past Medical History:  Diagnosis Date  . Migraines   . Panic attacks     Surgical History: Past Surgical History:  Procedure Laterality Date  . BLADDER SURGERY    . LUMBAR FUSION    . SPINAL FUSION    . TONSILLECTOMY    . TUBAL LIGATION    . TUBAL LIGATION      Home Medications:  Allergies as of 01/21/2018      Reactions   Doxycycline Nausea Only   Penicillins Rash      Medication List        Accurate as of 01/21/18 10:07 AM. Always use your most recent med list.          levonorgestrel 20 MCG/24HR IUD Commonly known as:  MIRENA 1 each by Intrauterine route once.   metoprolol succinate 25 MG 24 hr tablet Commonly known as:  TOPROL-XL Take 25 mg by mouth.   MIGRAINE FORMULA PO Take by mouth.       Allergies:  Allergies  Allergen Reactions  . Doxycycline Nausea Only  . Penicillins Rash    Family History: No family history on file.  Social History:  reports that she has never smoked. She has never used smokeless tobacco. She reports that she drinks alcohol. She reports that she does not use drugs.  ROS: UROLOGY Frequent Urination?: Yes Hard to postpone  urination?: Yes Burning/pain with urination?: Yes Get up at night to urinate?: No Leakage of urine?: Yes Urine stream starts and stops?: No Trouble starting stream?: No Do you have to strain to urinate?: No Blood in urine?: No Urinary tract infection?: No Sexually transmitted disease?: No Injury to kidneys or bladder?: No Painful intercourse?: Yes Weak stream?: No Currently pregnant?: No Vaginal bleeding?: No Last menstrual period?: n  Gastrointestinal Nausea?: Yes Vomiting?: No Indigestion/heartburn?: Yes Diarrhea?: No Constipation?: Yes  Constitutional Fever:  No Night sweats?: Yes Weight loss?: Yes Fatigue?: Yes  Skin Skin rash/lesions?: No Itching?: No  Eyes Blurred vision?: Yes Double vision?: No  Ears/Nose/Throat Sore throat?: No Sinus problems?: Yes  Hematologic/Lymphatic Swollen glands?: No Easy bruising?: Yes  Cardiovascular Leg swelling?: Yes Chest pain?: No  Respiratory Cough?: Yes Shortness of breath?: No  Endocrine Excessive thirst?: No  Musculoskeletal Back pain?: Yes Joint pain?: No  Neurological Headaches?: Yes Dizziness?: Yes  Psychologic Depression?: No Anxiety?: Yes  Physical Exam: BP 123/81 (BP Location: Left Arm, Patient Position: Sitting, Cuff Size: Normal)   Pulse 69   Ht 5\' 3"  (1.6 m)   Wt 109 lb 14.4 oz (49.9 kg)   LMP 12/13/2017 (Approximate)   BMI 19.47 kg/m   Constitutional:  Well nourished. Alert and oriented, No acute distress. HEENT: [place] AT, moist mucus membranes.  Trachea midline, no masses. Cardiovascular: No clubbing, cyanosis, or edema. Respiratory: Normal respiratory effort, no increased work of breathing. GI: Abdomen is soft, non tender, non distended, no abdominal masses. Liver and spleen not palpable.  No hernias appreciated.  Stool sample for occult testing is not indicated.   GU: No CVA tenderness.  No bladder fullness or masses.   Skin: No rashes, bruises or suspicious lesions. Lymph: No cervical or inguinal adenopathy. Neurologic: Grossly intact, no focal deficits, moving all 4 extremities. Psychiatric: Normal mood and affect.  Laboratory Data: Lab Results  Component Value Date   WBC 4.3 12/28/2011   HGB 13.0 12/28/2011   HCT 37.0 12/28/2011   MCV 90 12/28/2011   PLT 160 12/28/2011    Lab Results  Component Value Date   CREATININE 0.78 12/27/2011    No results found for: PSA  No results found for: TESTOSTERONE  No results found for: HGBA1C  No results found for: TSH  No results found for: CHOL, HDL, CHOLHDL, VLDL, LDLCALC  Lab Results  Component  Value Date   AST 16 12/27/2011   Lab Results  Component Value Date   ALT 47 12/27/2011   No components found for: ALKALINEPHOPHATASE No components found for: BILIRUBINTOTAL  No results found for: ESTRADIOL  Urinalysis Moderate bacteria.  See Epic.   I have reviewed the labs.   Pertinent Imaging: CLINICAL DATA:  Left flank pain.  EXAM: RENAL / URINARY TRACT ULTRASOUND COMPLETE  COMPARISON:  CT 08/09/2009, 05/17/2008.  FINDINGS: Right Kidney:  Length: 7.7 cm. Echogenicity within normal limits. No mass or hydronephrosis visualized.  Left Kidney:  Length: 11.6 cm. Echogenicity within normal limits. No mass. Moderate hydronephrosis noted on the left. Hydronephrosis improved following voiding.  Bladder:  Appears normal for degree of bladder distention.  IMPRESSION: 1. Small right kidney. This may be secondary to hyperplasia and or atrophy. Similar finding noted on prior exam.  2. Moderate left hydronephrosis which improved slightly following voiding.   Electronically Signed   By: Marcello Moores  Register   On: 01/12/2018 12:26 I have independently reviewed the films and note left hydronephrosis with improvement after voiding.  ? Of stone  in ureter vs refluxing   Assessment & Plan:    1. Left hydronephrosis Patient has a history of reflux and a history of nephrolithiasis.  It is possible that the left hydronephrosis is either due to to the refluxing since it improved on post void films or less likely a small ureteral stone was passed during the study and was not captured We will first obtain a CTU to evaluate for stones and hydronephrosis If CTU is negative, we will proceed with VCUG as she was symptomatic with left-sided flank pain She will return for report Patient is advised that if they should start to experience pain that is not able to be controlled with pain medication, intractable nausea and/or vomiting and/or fevers greater than 103 or shaking  chills to contact the office immediately or seek treatment in the emergency department for emergent intervention.    2. Hx of nephrolithiasis CTU pending  3. Hx of vesicoureteral reflux May obtain a VCUG pending CTU findings  Return for CT report .  These notes generated with voice recognition software. I apologize for typographical errors.  Zara Council, PA-C  [facility] [address]  [place], [place] [address] [phone]

## 2018-01-22 LAB — BUN+CREAT
BUN/Creatinine Ratio: 11 (ref 9–23)
BUN: 10 mg/dL (ref 6–20)
CREATININE: 0.94 mg/dL (ref 0.57–1.00)
GFR calc Af Amer: 91 mL/min/{1.73_m2} (ref >59)
GFR, EST NON AFRICAN AMERICAN: 79 mL/min/{1.73_m2} (ref >59)

## 2018-01-23 LAB — CULTURE, URINE COMPREHENSIVE

## 2018-01-28 ENCOUNTER — Ambulatory Visit
Admission: RE | Admit: 2018-01-28 | Discharge: 2018-01-28 | Disposition: A | Discharge status: Home / Self Care | Payer: 59 | Source: Ambulatory Visit | Attending: Urology | Admitting: Urology

## 2018-01-28 DIAGNOSIS — N200 Calculus of kidney: Secondary | ICD-10-CM | POA: Insufficient documentation

## 2018-01-28 DIAGNOSIS — N1339 Other hydronephrosis: Secondary | ICD-10-CM | POA: Insufficient documentation

## 2018-01-28 DIAGNOSIS — N261 Atrophy of kidney (terminal): Secondary | ICD-10-CM | POA: Diagnosis not present

## 2018-01-28 MED ORDER — IOPAMIDOL (ISOVUE-300) INJECTION 61%
100.0000 mL | Freq: Once | INTRAVENOUS | Status: AC | PRN
  Administered 2018-01-28: 100 mL via INTRAVENOUS

## 2018-02-02 ENCOUNTER — Other Ambulatory Visit: Payer: Self-pay

## 2018-02-02 ENCOUNTER — Ambulatory Visit (INDEPENDENT_AMBULATORY_CARE_PROVIDER_SITE_OTHER): Payer: 59 | Admitting: Urology

## 2018-02-02 ENCOUNTER — Encounter: Payer: Self-pay | Admitting: Urology

## 2018-02-02 VITALS — BP 110/73 | HR 59 | Ht 63.0 in | Wt 111.8 lb

## 2018-02-02 DIAGNOSIS — Z87442 Personal history of urinary calculi: Secondary | ICD-10-CM

## 2018-02-02 DIAGNOSIS — N1339 Other hydronephrosis: Secondary | ICD-10-CM | POA: Diagnosis not present

## 2018-02-02 DIAGNOSIS — Z87448 Personal history of other diseases of urinary system: Secondary | ICD-10-CM

## 2018-02-02 LAB — URINALYSIS, COMPLETE
Bilirubin, UA: NEGATIVE
GLUCOSE, UA: NEGATIVE
Ketones, UA: NEGATIVE
NITRITE UA: NEGATIVE
PH UA: 6.5 (ref 5.0–7.5)
Protein, UA: NEGATIVE
Specific Gravity, UA: 1.01 (ref 1.005–1.030)
UUROB: 0.2 mg/dL (ref 0.2–1.0)

## 2018-02-02 LAB — MICROSCOPIC EXAMINATION

## 2018-02-02 NOTE — Progress Notes (Signed)
02/02/2018 8:02 PM   Nicole Yu [birth year] [id number]  Referring provider: Sofie Hartigan, MD [place], [place] [address]  Chief Complaint  Patient presents with  . Hydronephrosis    f/u CT scan     HPI: Patient is a 37 year old Caucasian female with a history of vesicoureteral reflux repaired by bilateral ureteral reimplantation in 1986 who presents today for CTU report.    Background history She was seen by Dr. Jacqlyn Larsen in 2010 for rUTI's and for dysuria by Dr. Bernardo Heater in 2014.  She was lost to follow up and then returned to [facility] urology in 2017.  CTU in 2017 revealed the kidneys enhance symmetrically and the right kidney appears atrophic relative to the right. Noncontrast evaluation demonstrates several punctate (1-2 mm) nonobstructive calculi within the left kidney.  No focal renal mass lesions are identified.  Excretory phase images demonstrate normal opacification of the renal collecting system and proximal/mid ureters, without focal filling defect.The urinary bladder appears morphologically normal. The opacified portions of the urinary bladder demonstrate no focal filling defect.  She underwent urodynamics which noted normal bladder compliance and grade 2 reflux on the right.  Cystoscopy performed by Kieth Brightly PA noted a urethral stricture and numerous areas of irritation.  She was to return in 3 months for repeat cystoscopy, but she was lost to follow up.  She presented to Dr. Jorene Minors office on September 23 with a complaints of dysuria, suprapubic pain and left flank pain.  UA was positive for 4-10 WBC's and rare bacteria.  Her urine culture was negative.   RUS on 01/12/2018 revealed small right kidney. This may be secondary to hyperplasia and or atrophy. Similar finding noted on prior exam.  Moderate left hydronephrosis which improved slightly following voiding.  She is drinking water like crazy now since January 2019.    Her urine culture from 01/21/2018  was negative.    CTU on 01/28/2018 revealed normal adrenal glands. Noncontrast images demonstrate a 2 mm stone within the superior pole left kidney (image 70; series 5). Punctate stones demonstrated within the inferior pole of the left kidney. Kidneys enhance symmetrically with contrast.  Atrophic right kidney. Excreted contrast material is demonstrated into the bilateral renal collecting systems, proximal right ureter and left ureter. The left ureter is tortuous and minimally dilated.  No hydronephrosis. No suspicious enhancing renal masses. Urinary bladder is unremarkable.  Today, she is complaining of an occasional left flank pain.  She states the frequency and dysuria have abated.  Patient denies any gross hematuria, dysuria or suprapubic/flank pain.  Patient denies any fevers, chills, nausea or vomiting.   Her UA today is bland.   PMH: Past Medical History:  Diagnosis Date  . Heart palpitations   . Migraines   . Panic attacks     Surgical History: Past Surgical History:  Procedure Laterality Date  . BLADDER SURGERY  age 4  . LUMBAR FUSION    . SPINAL FUSION  2011   L4-5  . TONSILLECTOMY    . TUBAL LIGATION    . TUBAL LIGATION      Home Medications:  Allergies as of 02/02/2018      Reactions   Doxycycline Nausea Only   Penicillins Rash      Medication List        Accurate as of 02/02/18 11:59 PM. Always use your most recent med list.          levonorgestrel 20 MCG/24HR IUD  Commonly known as:  MIRENA 1 each by Intrauterine route once.   metoprolol succinate 25 MG 24 hr tablet Commonly known as:  TOPROL-XL Take 25 mg by mouth at bedtime.   MIGRAINE FORMULA PO Take by mouth.       Allergies:  Allergies  Allergen Reactions  . Doxycycline Nausea Only  . Penicillins Rash and Other (See Comments)    Has patient had a PCN reaction causing immediate rash, facial/tongue/throat swelling, SOB or lightheadedness with hypotension: No Has patient had a PCN reaction  causing severe rash involving mucus membranes or skin necrosis: No Has patient had a PCN reaction that required hospitalization: No Has patient had a PCN reaction occurring within the last 10 years: No If all of the above answers are "NO", then may proceed with Cephalosporin use.     Family History: Family History  Problem Relation Age of Onset  . Diabetes Father   . Hypercholesterolemia Mother     Social History:  reports that she has never smoked. She has never used smokeless tobacco. She reports that she drinks alcohol. She reports that she does not use drugs.  ROS: UROLOGY Frequent Urination?: Yes Hard to postpone urination?: No Burning/pain with urination?: Yes Get up at night to urinate?: No Leakage of urine?: No Urine stream starts and stops?: No Trouble starting stream?: No Do you have to strain to urinate?: No Blood in urine?: No Urinary tract infection?: No Sexually transmitted disease?: No Injury to kidneys or bladder?: No Painful intercourse?: Yes Weak stream?: No Currently pregnant?: No Vaginal bleeding?: No Last menstrual period?: n  Gastrointestinal Nausea?: Yes Vomiting?: No Indigestion/heartburn?: Yes Diarrhea?: No Constipation?: Yes  Constitutional Fever: No Night sweats?: No Weight loss?: No Fatigue?: Yes  Skin Skin rash/lesions?: No Itching?: No  Eyes Blurred vision?: Yes Double vision?: No  Ears/Nose/Throat Sore throat?: No Sinus problems?: No  Hematologic/Lymphatic Swollen glands?: No Easy bruising?: Yes  Cardiovascular Leg swelling?: No Chest pain?: No  Respiratory Cough?: No Shortness of breath?: No  Endocrine Excessive thirst?: No  Musculoskeletal Back pain?: Yes Joint pain?: No  Neurological Headaches?: Yes Dizziness?: Yes  Psychologic Depression?: No Anxiety?: Yes  Physical Exam: BP 110/73   Pulse (!) 59   Ht 5\' 3"  (1.6 m)   Wt 111 lb 12.8 oz (50.7 kg)   BMI 19.80 kg/m   Constitutional: Well  nourished. Alert and oriented, No acute distress. HEENT: [place] AT, moist mucus membranes. Trachea midline, no masses. Cardiovascular: No clubbing, cyanosis, or edema. Respiratory: Normal respiratory effort, no increased work of breathing. Skin: No rashes, bruises or suspicious lesions. Lymph: No cervical or inguinal adenopathy. Neurologic: Grossly intact, no focal deficits, moving all 4 extremities. Psychiatric: Normal mood and affect.  Laboratory Data: Lab Results  Component Value Date   WBC 4.3 12/28/2011   HGB 13.0 12/28/2011   HCT 37.0 12/28/2011   MCV 90 12/28/2011   PLT 160 12/28/2011    Lab Results  Component Value Date   CREATININE 0.94 01/21/2018    No results found for: PSA  No results found for: TESTOSTERONE  No results found for: HGBA1C  No results found for: TSH  No results found for: CHOL, HDL, CHOLHDL, VLDL, LDLCALC  Lab Results  Component Value Date   AST 16 12/27/2011   Lab Results  Component Value Date   ALT 47 12/27/2011   No components found for: ALKALINEPHOPHATASE No components found for: BILIRUBINTOTAL  No results found for: ESTRADIOL  Urinalysis Bland.  See Epic.  I have reviewed the labs.   Pertinent Imaging: CLINICAL DATA:  Patient with history of renal stones. History of cystitis. Left flank pain.  EXAM: CT ABDOMEN AND PELVIS WITHOUT AND WITH CONTRAST  TECHNIQUE: Multidetector CT imaging of the abdomen and pelvis was performed following the standard protocol before and following the bolus administration of intravenous contrast.  CONTRAST:  169mL ISOVUE-300 IOPAMIDOL (ISOVUE-300) INJECTION 61%  COMPARISON:  CT abdomen pelvis 05/17/2008.  FINDINGS: Lower chest: Normal heart size. Lung bases are clear. No pleural effusion.  Hepatobiliary: Liver is normal in size and contour. No focal hepatic lesion is identified. Gallbladder is unremarkable.  Pancreas: Unremarkable  Spleen: Unremarkable  Adrenals/Urinary  Tract: Normal adrenal glands. Noncontrast images demonstrate a 2 mm stone within the superior pole left kidney (image 70; series 5). Punctate stones demonstrated within the inferior pole of the left kidney. Kidneys enhance symmetrically with contrast. Atrophic right kidney. Excreted contrast material is demonstrated into the bilateral renal collecting systems, proximal right ureter and left ureter. The left ureter is tortuous and minimally dilated. No hydronephrosis. No suspicious enhancing renal masses. Urinary bladder is unremarkable.  Stomach/Bowel: No abnormal bowel wall thickening or evidence for bowel obstruction. Small amount of free fluid in the pelvis. No free intraperitoneal air. Normal morphology of the stomach.  Vascular/Lymphatic: Normal caliber abdominal aorta. No retroperitoneal adenopathy.  Reproductive: Uterus unremarkable. Intrauterine device is present. No adnexal masses.  Other: None.  Musculoskeletal: No aggressive or acute appearing osseous lesions. Lumbar spinal fusion hardware.  IMPRESSION: 1. Punctate stones left kidney. 2. Atrophic right kidney. 3. No suspicious filling defects within the opacified renal collecting systems and ureters. No enhancing renal masses.   Electronically Signed   By: Lovey Newcomer M.D.   On: 01/28/2018 12:09 I have independently reviewed the films and note that the left ureter has dilation and is torturous.   Assessment & Plan:    1. Left hydronephrosis Not appreciated on recent CTU  2. Hx of nephrolithiasis Punctate stones seen in the left kidney on CTU No intervention warranted at this time  3. Hx of vesicoureteral reflux Left ureter is dilation with a torturous component.  As she is still having left flank pain. We will obtain an urine culture to rule out any indolent infection.   If urine culture is positive, we will treat the infection and start her on a suppressive antibiotic.   If urine culture is  negative, we will start the suppressive antibiotic and then proceed with a cystogram in the OR so that her bladder may be evaluated (areas of irritation seen in bladder in 2017) and the degree of reflux may be evaluated as well.   Risks of procedure are explained to the patient and she is willing to proceed  Return for cystogram .  These notes generated with voice recognition software. I apologize for typographical errors.  Zara Council, PA-C  [facility] [address]  [place],  [address] [phone]

## 2018-02-02 NOTE — H&P (View-Only) (Signed)
02/02/2018 8:02 PM   Nicole Yu January 26, 1981 [id number]  Referring provider: Sofie Hartigan, MD [place], [place] [address]  Chief Complaint  Patient presents with  . Hydronephrosis    f/u CT scan     HPI: Patient is a 37 year old Caucasian female with a history of vesicoureteral reflux repaired by bilateral ureteral reimplantation in 1986 who presents today for CTU report.    Background history She was seen by Dr. Jacqlyn Larsen in 2010 for rUTI's and for dysuria by Dr. Bernardo Heater in 2014.  She was lost to follow up and then returned to [facility] urology in 2017.  CTU in 2017 revealed the kidneys enhance symmetrically and the right kidney appears atrophic relative to the right. Noncontrast evaluation demonstrates several punctate (1-2 mm) nonobstructive calculi within the left kidney.  No focal renal mass lesions are identified.  Excretory phase images demonstrate normal opacification of the renal collecting system and proximal/mid ureters, without focal filling defect.The urinary bladder appears morphologically normal. The opacified portions of the urinary bladder demonstrate no focal filling defect.  She underwent urodynamics which noted normal bladder compliance and grade 2 reflux on the right.  Cystoscopy performed by Kieth Brightly PA noted a urethral stricture and numerous areas of irritation.  She was to return in 3 months for repeat cystoscopy, but she was lost to follow up.  She presented to Dr. Jorene Minors office on September 23 with a complaints of dysuria, suprapubic pain and left flank pain.  UA was positive for 4-10 WBC's and rare bacteria.  Her urine culture was negative.   RUS on 01/12/2018 revealed small right kidney. This may be secondary to hyperplasia and or atrophy. Similar finding noted on prior exam.  Moderate left hydronephrosis which improved slightly following voiding.  She is drinking water like crazy now since January 2019.    Her urine culture from 01/21/2018  was negative.    CTU on 01/28/2018 revealed normal adrenal glands. Noncontrast images demonstrate a 2 mm stone within the superior pole left kidney (image 70; series 5). Punctate stones demonstrated within the inferior pole of the left kidney. Kidneys enhance symmetrically with contrast.  Atrophic right kidney. Excreted contrast material is demonstrated into the bilateral renal collecting systems, proximal right ureter and left ureter. The left ureter is tortuous and minimally dilated.  No hydronephrosis. No suspicious enhancing renal masses. Urinary bladder is unremarkable.  Today, she is complaining of an occasional left flank pain.  She states the frequency and dysuria have abated.  Patient denies any gross hematuria, dysuria or suprapubic/flank pain.  Patient denies any fevers, chills, nausea or vomiting.   Her UA today is bland.   PMH: Past Medical History:  Diagnosis Date  . Heart palpitations   . Migraines   . Panic attacks     Surgical History: Past Surgical History:  Procedure Laterality Date  . BLADDER SURGERY  age 74  . LUMBAR FUSION    . SPINAL FUSION  2011   L4-5  . TONSILLECTOMY    . TUBAL LIGATION    . TUBAL LIGATION      Home Medications:  Allergies as of 02/02/2018      Reactions   Doxycycline Nausea Only   Penicillins Rash      Medication List        Accurate as of 02/02/18 11:59 PM. Always use your most recent med list.          levonorgestrel 20 MCG/24HR IUD  Commonly known as:  MIRENA 1 each by Intrauterine route once.   metoprolol succinate 25 MG 24 hr tablet Commonly known as:  TOPROL-XL Take 25 mg by mouth at bedtime.   MIGRAINE FORMULA PO Take by mouth.       Allergies:  Allergies  Allergen Reactions  . Doxycycline Nausea Only  . Penicillins Rash and Other (See Comments)    Has patient had a PCN reaction causing immediate rash, facial/tongue/throat swelling, SOB or lightheadedness with hypotension: No Has patient had a PCN reaction  causing severe rash involving mucus membranes or skin necrosis: No Has patient had a PCN reaction that required hospitalization: No Has patient had a PCN reaction occurring within the last 10 years: No If all of the above answers are "NO", then may proceed with Cephalosporin use.     Family History: Family History  Problem Relation Age of Onset  . Diabetes Father   . Hypercholesterolemia Mother     Social History:  reports that she has never smoked. She has never used smokeless tobacco. She reports that she drinks alcohol. She reports that she does not use drugs.  ROS: UROLOGY Frequent Urination?: Yes Hard to postpone urination?: No Burning/pain with urination?: Yes Get up at night to urinate?: No Leakage of urine?: No Urine stream starts and stops?: No Trouble starting stream?: No Do you have to strain to urinate?: No Blood in urine?: No Urinary tract infection?: No Sexually transmitted disease?: No Injury to kidneys or bladder?: No Painful intercourse?: Yes Weak stream?: No Currently pregnant?: No Vaginal bleeding?: No Last menstrual period?: n  Gastrointestinal Nausea?: Yes Vomiting?: No Indigestion/heartburn?: Yes Diarrhea?: No Constipation?: Yes  Constitutional Fever: No Night sweats?: No Weight loss?: No Fatigue?: Yes  Skin Skin rash/lesions?: No Itching?: No  Eyes Blurred vision?: Yes Double vision?: No  Ears/Nose/Throat Sore throat?: No Sinus problems?: No  Hematologic/Lymphatic Swollen glands?: No Easy bruising?: Yes  Cardiovascular Leg swelling?: No Chest pain?: No  Respiratory Cough?: No Shortness of breath?: No  Endocrine Excessive thirst?: No  Musculoskeletal Back pain?: Yes Joint pain?: No  Neurological Headaches?: Yes Dizziness?: Yes  Psychologic Depression?: No Anxiety?: Yes  Physical Exam: BP 110/73   Pulse (!) 59   Ht 5\' 3"  (1.6 m)   Wt 111 lb 12.8 oz (50.7 kg)   BMI 19.80 kg/m   Constitutional: Well  nourished. Alert and oriented, No acute distress. HEENT: [place] AT, moist mucus membranes. Trachea midline, no masses. Cardiovascular: No clubbing, cyanosis, or edema. Respiratory: Normal respiratory effort, no increased work of breathing. Skin: No rashes, bruises or suspicious lesions. Lymph: No cervical or inguinal adenopathy. Neurologic: Grossly intact, no focal deficits, moving all 4 extremities. Psychiatric: Normal mood and affect.  Laboratory Data: Lab Results  Component Value Date   WBC 4.3 12/28/2011   HGB 13.0 12/28/2011   HCT 37.0 12/28/2011   MCV 90 12/28/2011   PLT 160 12/28/2011    Lab Results  Component Value Date   CREATININE 0.94 01/21/2018    No results found for: PSA  No results found for: TESTOSTERONE  No results found for: HGBA1C  No results found for: TSH  No results found for: CHOL, HDL, CHOLHDL, VLDL, LDLCALC  Lab Results  Component Value Date   AST 16 12/27/2011   Lab Results  Component Value Date   ALT 47 12/27/2011   No components found for: ALKALINEPHOPHATASE No components found for: BILIRUBINTOTAL  No results found for: ESTRADIOL  Urinalysis Bland.  See Epic.  I have reviewed the labs.   Pertinent Imaging: CLINICAL DATA:  Patient with history of renal stones. History of cystitis. Left flank pain.  EXAM: CT ABDOMEN AND PELVIS WITHOUT AND WITH CONTRAST  TECHNIQUE: Multidetector CT imaging of the abdomen and pelvis was performed following the standard protocol before and following the bolus administration of intravenous contrast.  CONTRAST:  181mL ISOVUE-300 IOPAMIDOL (ISOVUE-300) INJECTION 61%  COMPARISON:  CT abdomen pelvis 05/17/2008.  FINDINGS: Lower chest: Normal heart size. Lung bases are clear. No pleural effusion.  Hepatobiliary: Liver is normal in size and contour. No focal hepatic lesion is identified. Gallbladder is unremarkable.  Pancreas: Unremarkable  Spleen: Unremarkable  Adrenals/Urinary  Tract: Normal adrenal glands. Noncontrast images demonstrate a 2 mm stone within the superior pole left kidney (image 70; series 5). Punctate stones demonstrated within the inferior pole of the left kidney. Kidneys enhance symmetrically with contrast. Atrophic right kidney. Excreted contrast material is demonstrated into the bilateral renal collecting systems, proximal right ureter and left ureter. The left ureter is tortuous and minimally dilated. No hydronephrosis. No suspicious enhancing renal masses. Urinary bladder is unremarkable.  Stomach/Bowel: No abnormal bowel wall thickening or evidence for bowel obstruction. Small amount of free fluid in the pelvis. No free intraperitoneal air. Normal morphology of the stomach.  Vascular/Lymphatic: Normal caliber abdominal aorta. No retroperitoneal adenopathy.  Reproductive: Uterus unremarkable. Intrauterine device is present. No adnexal masses.  Other: None.  Musculoskeletal: No aggressive or acute appearing osseous lesions. Lumbar spinal fusion hardware.  IMPRESSION: 1. Punctate stones left kidney. 2. Atrophic right kidney. 3. No suspicious filling defects within the opacified renal collecting systems and ureters. No enhancing renal masses.   Electronically Signed   By: Lovey Newcomer M.D.   On: 01/28/2018 12:09 I have independently reviewed the films and note that the left ureter has dilation and is torturous.   Assessment & Plan:    1. Left hydronephrosis Not appreciated on recent CTU  2. Hx of nephrolithiasis Punctate stones seen in the left kidney on CTU No intervention warranted at this time  3. Hx of vesicoureteral reflux Left ureter is dilation with a torturous component.  As she is still having left flank pain. We will obtain an urine culture to rule out any indolent infection.   If urine culture is positive, we will treat the infection and start her on a suppressive antibiotic.   If urine culture is  negative, we will start the suppressive antibiotic and then proceed with a cystogram in the OR so that her bladder may be evaluated (areas of irritation seen in bladder in 2017) and the degree of reflux may be evaluated as well.   Risks of procedure are explained to the patient and she is willing to proceed  Return for cystogram .  These notes generated with voice recognition software. I apologize for typographical errors.  Zara Council, PA-C  [facility] [address]  [place], [place] [address] [phone]

## 2018-02-07 LAB — CULTURE, URINE COMPREHENSIVE

## 2018-02-08 ENCOUNTER — Telehealth: Payer: Self-pay

## 2018-02-08 ENCOUNTER — Telehealth: Payer: Self-pay | Admitting: Urology

## 2018-02-08 MED ORDER — TRIMETHOPRIM 100 MG PO TABS: 100.0000 mg | ORAL_TABLET | Freq: Every day | ORAL | 1 refills | Status: DC

## 2018-02-08 NOTE — Telephone Encounter (Signed)
Patient notified and RX sent to pharmacy.  

## 2018-02-08 NOTE — Telephone Encounter (Signed)
The surgeons are not in the office today and won't be back until tomorrow.  I need to speak with them prior to scheduling to see which procedure would give [place] the best information.

## 2018-02-08 NOTE — Telephone Encounter (Signed)
Called pt informed her of the information below. Pt questions exactly what procedure this is for as she states that you discussed two. Please advise. Also please advise on day supply that needs to be sent in as there is no procedure currently scheduled.

## 2018-02-08 NOTE — Telephone Encounter (Signed)
-----   Message from Nori Riis, PA-C sent at 02/08/2018 12:51 PM EDT ----- Please let Nicole Yu know that her urine culture was negative.  I would like her to start trimethoprim 100 mg daily to prevent an UTI, so that we can get her scheduled for her procedure.

## 2018-02-08 NOTE — Telephone Encounter (Signed)
We need to call in a 90 day supply.

## 2018-02-09 ENCOUNTER — Telehealth: Payer: Self-pay | Admitting: Urology

## 2018-02-09 ENCOUNTER — Other Ambulatory Visit: Payer: Self-pay | Admitting: Radiology

## 2018-02-09 ENCOUNTER — Ambulatory Visit: Payer: 59 | Admitting: Urology

## 2018-02-09 NOTE — Progress Notes (Unsigned)
Ir

## 2018-02-09 NOTE — Telephone Encounter (Signed)
Nicole Yu - Is this done in the OR or IR? Will need orders for cystogram if done in the OR.

## 2018-02-09 NOTE — Telephone Encounter (Signed)
Spoke with patient regarding the next step, either undergoing a cystoscopy in the office or cystogram in the OR.  She has chosen the cystogram in the OR.

## 2018-02-10 ENCOUNTER — Other Ambulatory Visit: Payer: Self-pay | Admitting: Radiology

## 2018-02-10 DIAGNOSIS — N1339 Other hydronephrosis: Secondary | ICD-10-CM

## 2018-02-10 DIAGNOSIS — Z87448 Personal history of other diseases of urinary system: Secondary | ICD-10-CM

## 2018-02-10 NOTE — Telephone Encounter (Signed)
Dr Erlene Quan, I have scheduled this patient with you on 02/17/2018 because she is not available on 02/16/2018 (she has seen Dr Bernardo Heater at [facility] in the past so I first offered her 11/5 with him). She requested that you do the surgery.

## 2018-02-12 ENCOUNTER — Other Ambulatory Visit: Payer: Self-pay

## 2018-02-12 ENCOUNTER — Encounter
Admission: RE | Admit: 2018-02-12 | Discharge: 2018-02-12 | Disposition: A | Discharge status: Home / Self Care | Payer: 59 | Source: Ambulatory Visit | Attending: Urology | Admitting: Urology

## 2018-02-12 DIAGNOSIS — Z01810 Encounter for preprocedural cardiovascular examination: Secondary | ICD-10-CM | POA: Insufficient documentation

## 2018-02-12 HISTORY — DX: Palpitations: R00.2

## 2018-02-12 NOTE — Patient Instructions (Signed)
Your procedure is scheduled on: Wednesday, February 17, 2018 Report to Day Surgery on the 2nd floor of the [facility]. To find out your arrival time, please call [phone] between 1PM - 3PM on: Tuesday, February 16, 2018  REMEMBER: Instructions that are not followed completely may result in serious medical risk, up to and including death; or upon the discretion of your surgeon and anesthesiologist your surgery may need to be rescheduled.  Do not eat food after midnight the night before surgery.  No gum chewing, lozengers or hard candies.  You may however, drink CLEAR liquids up to 2 hours before you are scheduled to arrive for your surgery. Do not drink anything within 2 hours of the start of your surgery.  Clear liquids include: - water  - apple juice without pulp - gatorade - black coffee or tea (Do NOT add milk or creamers to the coffee or tea) Do NOT drink anything that is not on this list.  No Alcohol for 24 hours before or after surgery.  No Smoking including e-cigarettes for 24 hours prior to surgery.  No chewable tobacco products for at least 6 hours prior to surgery.  No nicotine patches on the day of surgery.  On the morning of surgery brush your teeth with toothpaste and water, you may rinse your mouth with mouthwash if you wish. Do not swallow any toothpaste or mouthwash.  Notify your doctor if there is any change in your medical condition (cold, fever, infection).  Do not wear jewelry, make-up, hairpins, clips or nail polish.  Do not wear lotions, powders, or perfumes.   Do not shave 48 hours prior to surgery.   Contacts and dentures may not be worn into surgery.  Do not bring valuables to the hospital, including drivers license, insurance or credit cards.  [facility] is not responsible for any belongings or valuables.   TAKE THESE MEDICATIONS THE MORNING OF SURGERY:  1.  Trimethoprim  NOW!  Stop EXCEDRIN MIGRAINE, Anti-inflammatories (NSAIDS) such as  Advil, Aleve, Ibuprofen, Motrin, Naproxen, Naprosyn and Aspirin based products such as Excedrin, Goodys Powder, BC Powder. (May take Tylenol or Acetaminophen if needed.)  Stop ANY OVER THE COUNTER supplements until after surgery. (LYSINE)  Wear comfortable clothing (specific to your surgery type) to the hospital.  If you are being discharged the day of surgery, you will not be allowed to drive home. You will need a responsible adult to drive you home and stay with you that night.   If you are taking public transportation, you will need to have a responsible adult with you. Please confirm with your physician that it is acceptable to use public transportation.   Please call [phone] if you have any questions about these instructions.

## 2018-02-17 ENCOUNTER — Ambulatory Visit: Payer: 59 | Admitting: Certified Registered Nurse Anesthetist

## 2018-02-17 ENCOUNTER — Encounter
Admission: RE | Disposition: A | Discharge status: Home / Self Care | Payer: Self-pay | Source: Ambulatory Visit | Attending: Urology

## 2018-02-17 ENCOUNTER — Other Ambulatory Visit: Payer: Self-pay

## 2018-02-17 ENCOUNTER — Encounter: Payer: Self-pay | Admitting: *Deleted

## 2018-02-17 ENCOUNTER — Ambulatory Visit
Admission: RE | Admit: 2018-02-17 | Discharge: 2018-02-17 | Disposition: A | Discharge status: Home / Self Care | Payer: 59 | Source: Ambulatory Visit | Attending: Urology | Admitting: Urology

## 2018-02-17 DIAGNOSIS — R109 Unspecified abdominal pain: Secondary | ICD-10-CM | POA: Diagnosis not present

## 2018-02-17 DIAGNOSIS — N261 Atrophy of kidney (terminal): Secondary | ICD-10-CM | POA: Diagnosis not present

## 2018-02-17 DIAGNOSIS — N1339 Other hydronephrosis: Secondary | ICD-10-CM

## 2018-02-17 DIAGNOSIS — N200 Calculus of kidney: Secondary | ICD-10-CM | POA: Insufficient documentation

## 2018-02-17 DIAGNOSIS — N134 Hydroureter: Secondary | ICD-10-CM | POA: Diagnosis not present

## 2018-02-17 DIAGNOSIS — Z87448 Personal history of other diseases of urinary system: Secondary | ICD-10-CM | POA: Insufficient documentation

## 2018-02-17 HISTORY — PX: CYSTOGRAM: SHX6285

## 2018-02-17 HISTORY — PX: CYSTOSCOPY/RETROGRADE/URETEROSCOPY: SHX5316

## 2018-02-17 HISTORY — PX: CYSTOSCOPY: SHX5120

## 2018-02-17 LAB — POCT PREGNANCY, URINE: PREG TEST UR: NEGATIVE

## 2018-02-17 SURGERY — CYSTOGRAM
Anesthesia: General

## 2018-02-17 MED ORDER — OXYCODONE HCL 5 MG/5ML PO SOLN: 5.0000 mg | Freq: Once | ORAL | Status: DC | PRN

## 2018-02-17 MED ORDER — SUCCINYLCHOLINE CHLORIDE 20 MG/ML IJ SOLN
INTRAMUSCULAR | Status: DC | PRN
  Administered 2018-02-17: 100 mg via INTRAVENOUS

## 2018-02-17 MED ORDER — FAMOTIDINE 20 MG PO TABS
20.0000 mg | ORAL_TABLET | Freq: Once | ORAL | Status: AC
  Administered 2018-02-17: 20 mg via ORAL

## 2018-02-17 MED ORDER — FENTANYL CITRATE (PF) 100 MCG/2ML IJ SOLN: 25.0000 ug | INTRAMUSCULAR | Status: DC | PRN

## 2018-02-17 MED ORDER — DEXAMETHASONE SODIUM PHOSPHATE 10 MG/ML IJ SOLN
INTRAMUSCULAR | Status: DC | PRN
  Administered 2018-02-17: 8 mg via INTRAVENOUS

## 2018-02-17 MED ORDER — OXYCODONE HCL 5 MG PO TABS: 5.0000 mg | ORAL_TABLET | Freq: Once | ORAL | Status: DC | PRN

## 2018-02-17 MED ORDER — CIPROFLOXACIN IN D5W 400 MG/200ML IV SOLN
400.0000 mg | INTRAVENOUS | Status: AC
  Administered 2018-02-17: 400 mg via INTRAVENOUS

## 2018-02-17 MED ORDER — DEXAMETHASONE SODIUM PHOSPHATE 10 MG/ML IJ SOLN
INTRAMUSCULAR | Status: AC
  Filled 2018-02-17: qty 1

## 2018-02-17 MED ORDER — ONDANSETRON HCL 4 MG/2ML IJ SOLN
INTRAMUSCULAR | Status: DC | PRN
  Administered 2018-02-17: 4 mg via INTRAVENOUS

## 2018-02-17 MED ORDER — LIDOCAINE HCL (CARDIAC) PF 100 MG/5ML IV SOSY
PREFILLED_SYRINGE | INTRAVENOUS | Status: DC | PRN
  Administered 2018-02-17: 80 mg via INTRAVENOUS

## 2018-02-17 MED ORDER — IOPAMIDOL (ISOVUE-200) INJECTION 41%
INTRAVENOUS | Status: DC | PRN
  Administered 2018-02-17: 70 mL

## 2018-02-17 MED ORDER — LACTATED RINGERS IV SOLN
INTRAVENOUS | Status: DC
  Administered 2018-02-17: 12:00:00 via INTRAVENOUS

## 2018-02-17 MED ORDER — PROPOFOL 10 MG/ML IV BOLUS
INTRAVENOUS | Status: AC
  Filled 2018-02-17: qty 20

## 2018-02-17 MED ORDER — ROCURONIUM BROMIDE 100 MG/10ML IV SOLN
INTRAVENOUS | Status: DC | PRN
  Administered 2018-02-17: 5 mg via INTRAVENOUS

## 2018-02-17 MED ORDER — PROPOFOL 10 MG/ML IV BOLUS
INTRAVENOUS | Status: DC | PRN
  Administered 2018-02-17: 150 mg via INTRAVENOUS

## 2018-02-17 MED ORDER — ONDANSETRON HCL 4 MG/2ML IJ SOLN
INTRAMUSCULAR | Status: AC
  Filled 2018-02-17: qty 2

## 2018-02-17 MED ORDER — SEVOFLURANE IN SOLN
RESPIRATORY_TRACT | Status: AC
  Filled 2018-02-17: qty 500

## 2018-02-17 MED ORDER — PROMETHAZINE HCL 25 MG/ML IJ SOLN: 6.2500 mg | INTRAMUSCULAR | Status: DC | PRN

## 2018-02-17 MED ORDER — FENTANYL CITRATE (PF) 100 MCG/2ML IJ SOLN
INTRAMUSCULAR | Status: DC | PRN
  Administered 2018-02-17: 50 ug via INTRAVENOUS

## 2018-02-17 MED ORDER — MIDAZOLAM HCL 2 MG/2ML IJ SOLN
INTRAMUSCULAR | Status: AC
  Filled 2018-02-17: qty 2

## 2018-02-17 MED ORDER — CIPROFLOXACIN IN D5W 400 MG/200ML IV SOLN
INTRAVENOUS | Status: AC
  Filled 2018-02-17: qty 200

## 2018-02-17 MED ORDER — LIDOCAINE HCL (PF) 2 % IJ SOLN
INTRAMUSCULAR | Status: AC
  Filled 2018-02-17: qty 10

## 2018-02-17 MED ORDER — MIDAZOLAM HCL 2 MG/2ML IJ SOLN
INTRAMUSCULAR | Status: DC | PRN
  Administered 2018-02-17 (×2): 1 mg via INTRAVENOUS

## 2018-02-17 MED ORDER — FENTANYL CITRATE (PF) 100 MCG/2ML IJ SOLN
INTRAMUSCULAR | Status: AC
  Filled 2018-02-17: qty 2

## 2018-02-17 MED ORDER — FAMOTIDINE 20 MG PO TABS
ORAL_TABLET | ORAL | Status: AC
  Filled 2018-02-17: qty 1

## 2018-02-17 MED ORDER — MEPERIDINE HCL 50 MG/ML IJ SOLN: 6.2500 mg | INTRAMUSCULAR | Status: DC | PRN

## 2018-02-17 SURGICAL SUPPLY — 23 items
BAG DRAIN CYSTO-URO LG1000N (MISCELLANEOUS) ×4 IMPLANT
BRUSH SCRUB EZ 1% IODOPHOR (MISCELLANEOUS) ×4 IMPLANT
CATH URETL 5X70 OPEN END (CATHETERS) ×4 IMPLANT
DRAPE UTILITY 15X26 TOWEL STRL (DRAPES) ×4 IMPLANT
ELECT REM PT RETURN 9FT ADLT (ELECTROSURGICAL)
ELECTRODE REM PT RTRN 9FT ADLT (ELECTROSURGICAL) IMPLANT
GLOVE BIO SURGEON STRL SZ 6.5 (GLOVE) ×9 IMPLANT
GLOVE BIO SURGEONS STRL SZ 6.5 (GLOVE) ×3
GOWN STRL REUS W/ TWL LRG LVL3 (GOWN DISPOSABLE) ×4 IMPLANT
GOWN STRL REUS W/TWL LRG LVL3 (GOWN DISPOSABLE) ×4
INFUSOR MANOMETER BAG 3000ML (MISCELLANEOUS) ×4 IMPLANT
IV NS IRRIG 3000ML ARTHROMATIC (IV SOLUTION) ×4 IMPLANT
KIT TURNOVER CYSTO (KITS) ×4 IMPLANT
PACK CYSTO AR (MISCELLANEOUS) ×4 IMPLANT
SENSORWIRE 0.038 NOT ANGLED (WIRE) ×4
SET CYSTO W/LG BORE CLAMP LF (SET/KITS/TRAYS/PACK) ×4 IMPLANT
SOL .9 NS 3000ML IRR  AL (IV SOLUTION) ×2
SOL .9 NS 3000ML IRR UROMATIC (IV SOLUTION) ×2 IMPLANT
SURGILUBE 2OZ TUBE FLIPTOP (MISCELLANEOUS) ×4 IMPLANT
SYRINGE IRR TOOMEY STRL 70CC (SYRINGE) ×4 IMPLANT
WATER STERILE IRR 1000ML POUR (IV SOLUTION) ×4 IMPLANT
WATER STERILE IRR 3000ML UROMA (IV SOLUTION) ×4 IMPLANT
WIRE SENSOR 0.038 NOT ANGLED (WIRE) ×2 IMPLANT

## 2018-02-17 NOTE — Anesthesia Procedure Notes (Signed)
Procedure Name: Intubation Date/Time: 02/17/2018 12:41 PM Performed by: Johnna Acosta, CRNA Pre-anesthesia Checklist: Patient identified, Emergency Drugs available, Suction available, Patient being monitored and Timeout performed Patient Re-evaluated:Patient Re-evaluated prior to induction Oxygen Delivery Method: Circle system utilized Preoxygenation: Pre-oxygenation with 100% oxygen Induction Type: IV induction Ventilation: Mask ventilation without difficulty Laryngoscope Size: Miller and 2 Grade View: Grade I Tube type: Oral Tube size: 7.0 mm Number of attempts: 1 Airway Equipment and Method: Stylet Placement Confirmation: ETT inserted through vocal cords under direct vision,  positive ETCO2 and breath sounds checked- equal and bilateral Secured at: 20 cm Tube secured with: Tape Dental Injury: Teeth and Oropharynx as per pre-operative assessment

## 2018-02-17 NOTE — Interval H&P Note (Signed)
History and Physical Interval Note:  02/17/2018 12:26 PM  Nicole Yu  has presented today for surgery, with the diagnosis of Left hydroureter, h/o reflux  The various methods of treatment have been discussed with the patient and family. After consideration of risks, benefits and other options for treatment, the patient has consented to  Procedure(s): CYSTOGRAM (N/A) CYSTOSCOPY (N/A) as a surgical intervention .  The patient's history has been reviewed, patient examined, no change in status, stable for surgery.  I have reviewed the patient's chart and labs.  Questions were answered to the patient's satisfaction.    RRR CTAB  We discussed proceeding to the operating room for further diagnostic evaluation of left-sided hydroureter.  I also added to the consent form and discussed with her today the possibility of diagnostic left ureteroscopy, possible dilation if there is evidence of a stricture, and possible stent.  We discussed the risks and benefits.  All of her questions were answered.  She is agreeable this plan.  Hollice Espy

## 2018-02-17 NOTE — Op Note (Signed)
Date of procedure: 02/17/18  Preoperative diagnosis:  1. Left hydroureter 2. Left flank pain 3. History of vesicoureteral reflux  Postoperative diagnosis:  1. Same as above  Procedure: 1. Cystoscopy 2. Cystogram 3. Left retrograde pyelogram 4. Diagnostic left ureteroscopy  Surgeon: Hollice Espy, MD  Anesthesia: General  Complications: None  Intraoperative findings: No appreciable vesicoureteral reflux with fully distended bladder, 750 cc capacity under anesthesia.  Left retrograde with irregularity and iliacs, otherwise no hydronephrosis with prompt drainage.  Negative diagnostic left ureteroscopy.  EBL: Minimal  Specimens: None  Drains: None  Indication: Nicole Yu is a 37 y.o. patient with left flank pain and left hydroureter who presents today for further diagnostic evaluation.  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  She was counseled on various options including the option of pursuing diagnostic evaluation in the operating room which she is elected to pursue..  After reviewing the management options for treatment, she elected to proceed with the above surgical procedure(s). We have discussed the potential benefits and risks of the procedure, side effects of the proposed treatment, the likelihood of the patient achieving the goals of the procedure, and any potential problems that might occur during the procedure or recuperation. Informed consent has been obtained.  Description of procedure:  The patient was taken to the operating room and general anesthesia was induced.  The patient was placed in the dorsal lithotomy position, prepped and draped in the usual sterile fashion, and preoperative antibiotics were administered. A preoperative time-out was performed.   A 21 French scope was advanced per urethra into the bladder.  The bladder was carefully inspected and noted  to be grossly normal.  The UOs were both somewhat laterally displaced within the bladder but appeared to be otherwise patent and unremarkable.  At this point time, began to fill the bladder with a diluted contrast solution taking x-rays of the bladder filled.  I was ultimately able to fill the bladder with 750 cc with a normal bladder contour and [place] no evidence of reflux at this time.  The bladder was then drained.  No residual contrast remained within the bladder or upper tract collecting system as well as the ureters.  Attention was then turned to the left ureteral orifice which was cannulated using a 5 [place] open-ended ureteral catheter.  Gentle retrograde pyelogram was performed on the side which revealed a decompressed relatively delicate appearing ureter but some subtle dilation in the mid ureter down to level the iliacs where there was a small irregularity appreciated.  The contrast was allowed to drain and there is noted to be prompt excretion of the contrast from the collecting system.  A wire was then placed up to level the kidney.  A 4.5 semirigid ureteroscope was easily advanced up to the level of the proximal ureter inspecting along the way.  There are no intraluminal lesions identified, the scope advanced easily without evidence of stricture disease or any other pathologic findings.  The scope was then removed.  No residual contrast remained.  The patient was then cleaned and dried after bladder was empty, reversed from anesthesia, taken the PACU in stable condition.  Plan: Findings were discussed with the patient's family today.  There is no pathology appreciated.  The patient will follow-up with Zara Council in about 4 weeks to discuss further.  Hollice Espy, M.D.

## 2018-02-17 NOTE — Transfer of Care (Signed)
Immediate Anesthesia Transfer of Care Note  Patient: Nicole Yu  Procedure(s) Performed: CYSTOGRAM (N/A ) CYSTOSCOPY (N/A ) CYSTOSCOPY/RETROGRADE/URETEROSCOPY (Left )  Patient Location: PACU  Anesthesia Type:General  Level of Consciousness: awake, alert  and oriented  Airway & Oxygen Therapy: Patient Spontanous Breathing and Patient connected to face mask oxygen  Post-op Assessment: Report given to RN and Post -op Vital signs reviewed and stable  Post vital signs: Reviewed and stable  Last Vitals:  Vitals Value Taken Time  BP 114/66 02/17/2018  1:24 PM  Temp 36.2 C 02/17/2018  1:24 PM  Pulse 93 02/17/2018  1:25 PM  Resp 14 02/17/2018  1:25 PM  SpO2 100 % 02/17/2018  1:25 PM  Vitals shown include unvalidated device data.  Last Pain:  Vitals:   02/17/18 1216  TempSrc: Tympanic  PainSc: 0-No pain         Complications: No apparent anesthesia complications

## 2018-02-17 NOTE — Anesthesia Post-op Follow-up Note (Signed)
Anesthesia QCDR form completed.        

## 2018-02-17 NOTE — Anesthesia Preprocedure Evaluation (Signed)
Anesthesia Evaluation  Patient identified by MRN, date of birth, ID band Patient awake    Reviewed: Allergy & Precautions, NPO status , Patient's Chart, lab work & pertinent test results  History of Anesthesia Complications Negative for: history of anesthetic complications  Airway Mallampati: I  TM Distance: >3 FB Neck ROM: Full    Dental no notable dental hx.    Pulmonary neg pulmonary ROS, neg sleep apnea, neg COPD,    breath sounds clear to auscultation- rhonchi (-) wheezing      Cardiovascular Exercise Tolerance: Good (-) hypertension(-) CAD and (-) Past MI  Rhythm:Regular Rate:Normal - Systolic murmurs and - Diastolic murmurs    Neuro/Psych  Headaches, Anxiety    GI/Hepatic negative GI ROS, Neg liver ROS,   Endo/Other  negative endocrine ROSneg diabetes  Renal/GU negative Renal ROS     Musculoskeletal negative musculoskeletal ROS (+)   Abdominal (+) - obese,   Peds  Hematology negative hematology ROS (+)   Anesthesia Other Findings Past Medical History: No date: Heart palpitations No date: Migraines No date: Panic attacks   Reproductive/Obstetrics                             Anesthesia Physical Anesthesia Plan  ASA: II  Anesthesia Plan: General   Post-op Pain Management:    Induction: Intravenous  PONV Risk Score and Plan: 2 and Ondansetron, Dexamethasone and Midazolam  Airway Management Planned: Oral ETT  Additional Equipment:   Intra-op Plan:   Post-operative Plan: Extubation in OR  Informed Consent: I have reviewed the patients History and Physical, chart, labs and discussed the procedure including the risks, benefits and alternatives for the proposed anesthesia with the patient or authorized representative who has indicated his/her understanding and acceptance.   Dental advisory given  Plan Discussed with: CRNA and Anesthesiologist  Anesthesia Plan Comments:          Anesthesia Quick Evaluation

## 2018-02-17 NOTE — Anesthesia Postprocedure Evaluation (Signed)
Anesthesia Post Note  Patient: Nicole Yu  Procedure(s) Performed: CYSTOGRAM (N/A ) CYSTOSCOPY (N/A ) CYSTOSCOPY/RETROGRADE/URETEROSCOPY (Left )  Patient location during evaluation: PACU Anesthesia Type: General Level of consciousness: awake and alert and oriented Pain management: pain level controlled Vital Signs Assessment: post-procedure vital signs reviewed and stable Respiratory status: spontaneous breathing, nonlabored ventilation and respiratory function stable Cardiovascular status: blood pressure returned to baseline and stable Postop Assessment: no signs of nausea or vomiting Anesthetic complications: no     Last Vitals:  Vitals:   02/17/18 1403 02/17/18 1445  BP: 109/79 108/68  Pulse: 73 76  Resp:  14  Temp: 36.8 C   SpO2: 100% 100%    Last Pain:  Vitals:   02/17/18 1445  TempSrc:   PainSc: 2                  Fernando Stoiber

## 2018-02-17 NOTE — Discharge Instructions (Addendum)
Ureteroscopy, Care After °This sheet gives you information about how to care for yourself after your procedure. Your health care provider may also give you more specific instructions. If you have problems or questions, contact your health care provider. °What can I expect after the procedure? °After the procedure, it is common to have: °· A burning sensation when you urinate. °· Blood in your urine. °· Mild discomfort in the bladder area or kidney area when urinating. °· Needing to urinate more often or urgently. ° °Follow these instructions at home: °Medicines °· Take over-the-counter and prescription medicines only as told by your health care provider. °· If you were prescribed an antibiotic medicine, take it as told by your health care provider. Do not stop taking the antibiotic even if you start to feel better. °General instructions ° °· Do not drive for 24 hours if you were given a medicine to help you relax (sedative) during your procedure. °· To relieve burning, try taking a warm bath or holding a warm washcloth over your groin. °· Drink enough fluid to keep your urine clear or pale yellow. °? Drink two 8-ounce glasses of water every hour for the first 2 hours after you get home. °? Continue to drink water often at home. °· You can eat what you usually do. °· Keep all follow-up visits as told by your health care provider. This is important. °? If you had a tube placed to keep urine flowing (ureteral stent), ask your health care provider when you need to return to have it removed. °Contact a health care provider if: °· You have chills or a fever. °· You have burning pain for longer than 24 hours after the procedure. °· You have blood in your urine for longer than 24 hours after the procedure. °Get help right away if: °· You have large amounts of blood in your urine. °· You have blood clots in your urine. °· You have very bad pain. °· You have chest pain or trouble breathing. °· You are unable to urinate and  you have the feeling of a full bladder. °This information is not intended to replace advice given to you by your health care provider. Make sure you discuss any questions you have with your health care provider. °Document Released: 04/05/2013 Document Revised: 01/15/2016 Document Reviewed: 01/11/2016 °Elsevier Interactive Patient Education © 2018 [employer] ° ° °AMBULATORY SURGERY  °DISCHARGE INSTRUCTIONS ° ° °1) The drugs that you were given will stay in your system until tomorrow so for the next 24 hours you should not: ° °A) Drive an automobile °B) Make any legal decisions °C) Drink any alcoholic beverage ° ° °2) You may resume regular meals tomorrow.  Today it is better to start with liquids and gradually work up to solid foods. ° °You may eat anything you prefer, but it is better to start with liquids, then soup and crackers, and gradually work up to solid foods. ° ° °3) Please notify your doctor immediately if you have any unusual bleeding, trouble breathing, redness and pain at the surgery site, drainage, fever, or pain not relieved by medication. ° ° ° °4) Additional Instructions: ° ° ° ° ° ° ° °Please contact your physician with any problems or Same Day Surgery at [phone], Monday through Friday 6 am to 4 pm, or [facility] [place] [facility] number at [phone]. ° °

## 2018-02-18 ENCOUNTER — Encounter: Payer: Self-pay | Admitting: Urology

## 2018-02-18 NOTE — Telephone Encounter (Signed)
Medication sent in by provider.

## 2018-03-23 ENCOUNTER — Ambulatory Visit: Payer: 59 | Admitting: Urology

## 2018-06-26 ENCOUNTER — Emergency Department: Payer: 59

## 2018-06-26 ENCOUNTER — Other Ambulatory Visit: Payer: Self-pay

## 2018-06-26 DIAGNOSIS — M25472 Effusion, left ankle: Secondary | ICD-10-CM | POA: Diagnosis present

## 2018-06-26 DIAGNOSIS — Z79899 Other long term (current) drug therapy: Secondary | ICD-10-CM | POA: Diagnosis not present

## 2018-06-26 DIAGNOSIS — M79662 Pain in left lower leg: Secondary | ICD-10-CM | POA: Diagnosis not present

## 2018-06-26 NOTE — ED Triage Notes (Signed)
Patient reports left ankle swelling, denies any type injury.  Reports Thursday noticed pain behind her left knee.  Reports left leg with ache off/on since then.

## 2018-06-27 ENCOUNTER — Emergency Department
Admission: EM | Admit: 2018-06-27 | Discharge: 2018-06-27 | Disposition: A | Discharge status: Home / Self Care | Payer: 59 | Attending: Emergency Medicine | Admitting: Emergency Medicine

## 2018-06-27 DIAGNOSIS — M25472 Effusion, left ankle: Secondary | ICD-10-CM

## 2018-06-27 NOTE — ED Provider Notes (Signed)
[facility] Emergency Department Provider Note  ____________________________________________   First MD Initiated Contact with Patient 06/27/18 0141     (approximate)  I have reviewed the triage vital signs and the nursing notes.   HISTORY  Chief Complaint Leg Swelling    HPI Nicole Yu is a 38 y.o. female with medical issues as listed below who presents for evaluation of a couple of days of persistent dull aching pain behind her left knee and intermittent swelling of her left leg particular on the ankle.  The symptoms are mild but they were concerning to her given the fact that she was having some pain behind the knee as well as some swelling only around the left ankle.  It does seem to be worse at the end of the day and improves with rest but does not always go away completely.  No numbness or tingling and no weakness in the extremity.  She has no history of heart failure and no history of blood clots in the legs of the lungs.  Nothing particular makes her symptoms better or worse except as described above.  She has had no recent medication changes although she does take metoprolol for palpitations but has been on that for a while.  She has a cardiologist.  She denies fever/chills, chest pain, shortness of breath, nausea, vomiting, and abdominal pain.  No recent traumatic injury to the foot.         Past Medical History:  Diagnosis Date  . Heart palpitations   . Migraines   . Panic attacks     Patient Active Problem List   Diagnosis Date Noted  . Menorrhagia with irregular cycle 01/21/2018  . Atypical chest pain 06/03/2017  . Dizziness 02/05/2017  . Heart palpitations 02/05/2017  . 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 Vesico-ureteral reflux 06/28/2015  . Dysuria 05/07/2015  . Frequent UTI 05/07/2015  . Anxiety 07/26/2014  . Migraine 07/26/2014  . Other chronic cystitis 09/07/2012    Past Surgical History:  Procedure Laterality Date   . BLADDER SURGERY  age 66  . CYSTOGRAM N/A 02/17/2018   Procedure: CYSTOGRAM;  Surgeon: Hollice Espy, MD;  Location: [facility] ORS;  Service: Urology;  Laterality: N/A;  . CYSTOSCOPY N/A 02/17/2018   Procedure: CYSTOSCOPY;  Surgeon: Hollice Espy, MD;  Location: [facility] ORS;  Service: Urology;  Laterality: N/A;  . CYSTOSCOPY/RETROGRADE/URETEROSCOPY Left 02/17/2018   Procedure: CYSTOSCOPY/RETROGRADE/URETEROSCOPY;  Surgeon: Hollice Espy, MD;  Location: [facility] ORS;  Service: Urology;  Laterality: Left;  . LUMBAR FUSION    . SPINAL FUSION  2011   L4-5  . TONSILLECTOMY    . TUBAL LIGATION    . TUBAL LIGATION      Prior to Admission medications   Medication Sig Start Date End Date Taking? Authorizing Provider  aspirin-acetaminophen-caffeine (EXCEDRIN MIGRAINE) [phone] MG tablet Take 2 tablets by mouth daily.    [provider]  levonorgestrel (MIRENA) 20 MCG/24HR IUD 1 each by Intrauterine route once.    [provider]  Lysine 500 MG TABS Take 1,000-1,500 mg by mouth daily as needed (for fever blister).    [provider]  metoprolol succinate (TOPROL-XL) 25 MG 24 hr tablet Take 25 mg by mouth at bedtime.  10/30/17   [provider]  trimethoprim (TRIMPEX) 100 MG tablet Take 1 tablet (100 mg total) by mouth daily. 02/08/18   Zara Council A, PA-C    Allergies Doxycycline and Penicillins  Family History  Problem Relation Age of  Onset  . Diabetes Father   . Hypercholesterolemia Mother     Social History Social History   Tobacco Use  . Smoking status: Never Smoker  . Smokeless tobacco: Never Used  Substance Use Topics  . Alcohol use: Yes    Comment: social  . Drug use: No    Review of Systems Constitutional: No fever/chills Eyes: No visual changes. ENT: No sore throat. Cardiovascular: Denies chest pain. Respiratory: Denies shortness of breath. Gastrointestinal: No abdominal pain.  No nausea, no vomiting.  No diarrhea.  No constipation.  Genitourinary: Negative for dysuria. Musculoskeletal: Intermittent edema of the left lower extremity with some pain behind the left knee as described above. Integumentary: Negative for rash. Neurological: Negative for headaches, focal weakness or numbness.   ____________________________________________   PHYSICAL EXAM:  VITAL SIGNS: ED Triage Vitals  Enc Vitals Group     BP 06/26/18 2248 128/66     Pulse Rate 06/26/18 2248 87     Resp 06/26/18 2248 18     Temp 06/26/18 2248 98.2 F (36.8 C)     Temp Source 06/26/18 2248 Oral     SpO2 06/26/18 2248 100 %     Weight 06/26/18 2248 49.9 kg (110 lb)     Height 06/26/18 2248 1.6 m (5\' 3" )     Head Circumference --      Peak Flow --      Pain Score 06/27/18 0136 1     Pain Loc --      Pain Edu? --      Excl. in [place]? --     Constitutional: Alert and oriented. Well appearing and in no acute distress. Eyes: Conjunctivae are normal.  Head: Atraumatic. Nose: No congestion/rhinnorhea. Mouth/Throat: Mucous membranes are moist. Neck: No stridor.  No meningeal signs.   Cardiovascular: Normal rate, regular rhythm. Good peripheral circulation. Grossly normal heart sounds. Respiratory: Normal respiratory effort.  No retractions. Lungs CTAB. Gastrointestinal: Soft and nontender. No distention.  Musculoskeletal: No lower extremity tenderness nor edema. No gross deformities of extremities. Neurologic:  Normal speech and language. No gross focal neurologic deficits are appreciated.  Skin:  Skin is warm, dry and intact. No rash noted. Psychiatric: Mood and affect are normal. Speech and behavior are normal.  ____________________________________________   LABS (all labs ordered are listed, but only abnormal results are displayed)  Labs Reviewed - No data to display ____________________________________________  EKG  None - EKG not ordered by ED physician ____________________________________________  RADIOLOGY   ED MD interpretation:  No evidence of DVT nor popliteal cyst.  Official radiology report(s): US Venous Img Lower Unilateral Left  Result Date: 06/26/2018 CLINICAL DATA:  Leg pain and edema EXAM: Left LOWER EXTREMITY VENOUS DOPPLER ULTRASOUND TECHNIQUE: Gray-scale sonography with graded compression, as well as color Doppler and duplex ultrasound were performed to evaluate the lower extremity deep venous systems from the level of the common femoral vein and including the common femoral, femoral, profunda femoral, popliteal and calf veins including the posterior tibial, peroneal and gastrocnemius veins when visible. The superficial great saphenous vein was also interrogated. Spectral Doppler was utilized to evaluate flow at rest and with distal augmentation maneuvers in the common femoral, femoral and popliteal veins. COMPARISON:  None. FINDINGS: Contralateral Common Femoral Vein: Respiratory phasicity is normal and symmetric with the symptomatic side. No evidence of thrombus. Normal compressibility. Common Femoral Vein: No evidence of thrombus. Normal compressibility, respiratory phasicity and response to augmentation. Saphenofemoral Junction: No evidence of thrombus. Normal compressibility and flow on  color Doppler imaging. Profunda Femoral Vein: No evidence of thrombus. Normal compressibility and flow on color Doppler imaging. Femoral Vein: No evidence of thrombus. Normal compressibility, respiratory phasicity and response to augmentation. Popliteal Vein: No evidence of thrombus. Normal compressibility, respiratory phasicity and response to augmentation. Calf Veins: No evidence of thrombus. Normal compressibility and flow on color Doppler imaging. IMPRESSION: No evidence of deep venous thrombosis. Electronically Signed   By: Donavan Foil M.D.   On: 06/26/2018 23:45    ____________________________________________   PROCEDURES   Procedure(s) performed (including Critical Care):  Procedures    ____________________________________________   INITIAL IMPRESSION / MDM / [place] / ED COURSE  As part of my medical decision making, I reviewed the following data within the [place] notes reviewed and incorporated, Old chart reviewed and [place] Controlled Substance Database         Differential diagnosis includes, but is not limited to, musculoskeletal strain, DVT, popliteal cyst, CHF, arterial occlusion.  The patient's legs are similar in size and have good distal perfusion and easily palpable pulses.  There is no edema at this time.  There is no evidence of traumatic injury nor gross deformity.  Her ultrasound was negative for DVT.  She commented that the swelling had resolved during the time that she was waiting room.  We discussed that there does not seem to be any emergent cause of her symptoms at this time that she should follow-up with a cardiologist, and I gave my usual customary recommendations including elevation and compression to help with the symptoms.  She understands and agrees with the plan and will schedule a follow-up appointment with her cardiologist.  I gave my usual customary return precautions.     ____________________________________________  FINAL CLINICAL IMPRESSION(S) / ED DIAGNOSES  Final diagnoses:  Left ankle swelling     MEDICATIONS GIVEN DURING THIS VISIT:  Medications - No data to display   ED Discharge Orders    None       Note:  This document was prepared using Dragon voice recognition software and may include unintentional dictation errors.   Hinda Kehr, MD 06/27/18 [phone]

## 2018-06-27 NOTE — Discharge Instructions (Signed)
Your workup in the Emergency Department today was reassuring.  We did not find any specific abnormalities.  We recommend you drink plenty of fluids, take your regular medications and/or any new ones prescribed today, and follow up with the doctor(s) listed in these documents as recommended.  Return to the Emergency Department if you develop new or worsening symptoms that concern you.  

## 2018-09-16 ENCOUNTER — Other Ambulatory Visit: Payer: Self-pay | Admitting: Obstetrics and Gynecology

## 2018-09-16 DIAGNOSIS — N6320 Unspecified lump in the left breast, unspecified quadrant: Secondary | ICD-10-CM

## 2018-09-21 ENCOUNTER — Ambulatory Visit
Admission: RE | Admit: 2018-09-21 | Discharge: 2018-09-21 | Disposition: A | Discharge status: Home / Self Care | Payer: 59 | Source: Ambulatory Visit | Attending: Obstetrics and Gynecology | Admitting: Obstetrics and Gynecology

## 2018-09-21 ENCOUNTER — Other Ambulatory Visit: Payer: Self-pay

## 2018-09-21 DIAGNOSIS — N6320 Unspecified lump in the left breast, unspecified quadrant: Secondary | ICD-10-CM

## 2018-12-08 DIAGNOSIS — R609 Edema, unspecified: Secondary | ICD-10-CM | POA: Insufficient documentation

## 2018-12-08 DIAGNOSIS — R600 Localized edema: Secondary | ICD-10-CM | POA: Insufficient documentation

## 2019-06-08 ENCOUNTER — Other Ambulatory Visit: Payer: Self-pay

## 2019-06-08 ENCOUNTER — Ambulatory Visit (INDEPENDENT_AMBULATORY_CARE_PROVIDER_SITE_OTHER): Payer: 59 | Admitting: Urology

## 2019-06-08 ENCOUNTER — Encounter: Payer: Self-pay | Admitting: Urology

## 2019-06-08 VITALS — BP 106/63 | HR 80 | Ht 63.0 in | Wt 112.0 lb

## 2019-06-08 DIAGNOSIS — N1339 Other hydronephrosis: Secondary | ICD-10-CM | POA: Diagnosis not present

## 2019-06-08 DIAGNOSIS — R300 Dysuria: Secondary | ICD-10-CM | POA: Diagnosis not present

## 2019-06-08 LAB — BLADDER SCAN AMB NON-IMAGING: Scan Result: 0

## 2019-06-08 MED ORDER — TRIMETHOPRIM 100 MG PO TABS: 100.0000 mg | ORAL_TABLET | Freq: Every day | ORAL | 1 refills | Status: DC

## 2019-06-08 MED ORDER — CIPROFLOXACIN HCL 500 MG PO TABS: 500.0000 mg | ORAL_TABLET | Freq: Two times a day (BID) | ORAL | 0 refills | Status: DC

## 2019-06-09 ENCOUNTER — Telehealth: Payer: Self-pay | Admitting: Family Medicine

## 2019-06-09 DIAGNOSIS — N1339 Other hydronephrosis: Secondary | ICD-10-CM

## 2019-06-09 LAB — MICROSCOPIC EXAMINATION
Epithelial Cells (non renal): >10 /hpf — AB (ref 0–10)
WBC, UA: >30 /hpf — AB (ref 0–5)

## 2019-06-09 LAB — URINALYSIS, COMPLETE
Bilirubin, UA: NEGATIVE
Glucose, UA: NEGATIVE
Nitrite, UA: NEGATIVE
Protein,UA: NEGATIVE
Specific Gravity, UA: 1.02 (ref 1.005–1.030)
Urobilinogen, Ur: 0.2 mg/dL (ref 0.2–1.0)
pH, UA: 5 (ref 5.0–7.5)

## 2019-06-09 LAB — BUN+CREAT
BUN/Creatinine Ratio: 21 (ref 9–23)
BUN: 17 mg/dL (ref 6–20)
Creatinine, Ser: 0.81 mg/dL (ref 0.57–1.00)
GFR calc Af Amer: 108 mL/min/{1.73_m2} (ref >59)
GFR calc non Af Amer: 94 mL/min/{1.73_m2} (ref >59)

## 2019-06-09 NOTE — Telephone Encounter (Signed)
-----   Message from Nori Riis, PA-C sent at 06/09/2019 11:05 AM EST ----- [name]'s urine did not get sent for culture.  Would you call her and ask if she could drop off a specimen either today or tomorrow so we can send that for culture?

## 2019-06-09 NOTE — Telephone Encounter (Signed)
Patient notified and will come tomorrow.

## 2019-06-10 ENCOUNTER — Other Ambulatory Visit: Payer: 59

## 2019-06-10 ENCOUNTER — Other Ambulatory Visit: Payer: Self-pay

## 2019-06-10 DIAGNOSIS — N1339 Other hydronephrosis: Secondary | ICD-10-CM

## 2019-06-15 LAB — CULTURE, URINE COMPREHENSIVE

## 2019-06-16 NOTE — Progress Notes (Signed)
06/08/2019 8:01 AM   Nicole Yu 11-26-1980 [id number]  Referring provider: Sofie Hartigan, MD [place],  [place] [address]  Chief Complaint  Patient presents with  . Hydronephrosis    HPI: Nicole Yu is a 39 year old female with a history of vesicoureteral reflux repaired by bilateral ureteral reimplantation in 1986 who presents today cystitis.  History of vesicoureteral reflux Repaired by bilateral ureteral reimplantation 1986.  CTU in 2017 revealed the kidneys enhance symmetrically and the right kidney appears atrophic relative to the right. Noncontrast evaluation demonstrates several punctate (1-2 mm) nonobstructive calculi within the left kidney.  No focal renal mass lesions are identified.  Excretory phase images demonstrate normal opacification of the renal collecting system and proximal/mid ureters, without focal filling defect.The urinary bladder appears morphologically normal. The opacified portions of the urinary bladder demonstrate no focal filling defect.  She underwent urodynamics which noted normal bladder compliance and grade 2 reflux on the right.  Cystoscopy performed by Kieth Brightly PA noted a urethral stricture and numerous areas of irritation.  RUS on 01/12/2018 revealed small right kidney. This may be secondary to hyperplasia and or atrophy. Similar finding noted on prior exam.  Moderate left hydronephrosis which improved slightly following voiding.  CTU on 01/28/2018 revealed normal adrenal glands. Noncontrast images demonstrate a 2 mm stone within the superior pole left kidney (image 70; series 5). Punctate stones demonstrated within the inferior pole of the left kidney. Kidneys enhance symmetrically with contrast.  Atrophic right kidney. Excreted contrast material is demonstrated into the bilateral renal collecting systems, proximal right ureter and left ureter. The left ureter is tortuous and minimally dilated.  No hydronephrosis. No suspicious  enhancing renal masses. Urinary bladder is unremarkable.  Underwent cystoscopy, cystogram, left retrograde pyelogram and diagnostic left ureteroscopy.  Intraoperative findings: No appreciable vesicoureteral reflux with fully distended bladder, 750 cc capacity under anesthesia.  Left retrograde with irregularity and iliacs, otherwise no hydronephrosis with prompt drainage.  Negative diagnostic left ureteroscopy.  Today, she states she has had symptoms of increased frequency and urgency associated with dysuria.  This may have been precipitated by sexual activity.  She had some left over Cipro on hand, so she started that antibiotic and is feeling somewhat better.  Patient denies any modifying or aggravating factors.  Patient denies any gross hematuria.  Patient denies any fevers, chills, nausea or vomiting.   Her UA is yellow cloudy, 3+ ketone, specific gravity 1.020, trace blood, pH 5.0, leukocyte +1, greater than 30 WBCs, 0-2 RBCs, greater than 10 epithelial cells and moderate bacteria.  Her PVR is 0 mL.    She has been started on Lasix recently due to peripheral edema and she has been on a keto diet.    PMH: Past Medical History:  Diagnosis Date  . Heart palpitations   . Migraines   . Panic attacks     Surgical History: Past Surgical History:  Procedure Laterality Date  . BLADDER SURGERY  age 89  . CYSTOGRAM N/A 02/17/2018   Procedure: CYSTOGRAM;  Surgeon: Hollice Espy, MD;  Location: [facility] ORS;  Service: Urology;  Laterality: N/A;  . CYSTOSCOPY N/A 02/17/2018   Procedure: CYSTOSCOPY;  Surgeon: Hollice Espy, MD;  Location: [facility] ORS;  Service: Urology;  Laterality: N/A;  . CYSTOSCOPY/RETROGRADE/URETEROSCOPY Left 02/17/2018   Procedure: CYSTOSCOPY/RETROGRADE/URETEROSCOPY;  Surgeon: Hollice Espy, MD;  Location: [facility] ORS;  Service: Urology;  Laterality: Left;  . LUMBAR FUSION    . SPINAL FUSION  2011  L4-5  . TONSILLECTOMY    . TUBAL LIGATION    . TUBAL LIGATION      Home  Medications:  Allergies as of 06/08/2019      Reactions   Doxycycline Nausea Only   Penicillins Rash, Other (See Comments)   Has patient had a PCN reaction causing immediate rash, facial/tongue/throat swelling, SOB or lightheadedness with hypotension: No Has patient had a PCN reaction causing severe rash involving mucus membranes or skin necrosis: No Has patient had a PCN reaction that required hospitalization: No Has patient had a PCN reaction occurring within the last 10 years: No If all of the above answers are "NO", then may proceed with Cephalosporin use.      Medication List       Accurate as of June 08, 2019 11:59 PM. If you have any questions, ask your nurse or doctor.        aspirin-acetaminophen-caffeine 250-250-65 MG tablet Commonly known as: EXCEDRIN MIGRAINE Take 2 tablets by mouth daily.   ciprofloxacin 500 MG tablet Commonly known as: CIPRO Take 1 tablet (500 mg total) by mouth every 12 (twelve) hours. Started by: Zara Council, PA-C   levonorgestrel 20 MCG/24HR IUD Commonly known as: MIRENA 1 each by Intrauterine route once.   Lysine 500 MG Tabs Take 1,000-1,500 mg by mouth daily as needed (for fever blister).   metoprolol succinate 25 MG 24 hr tablet Commonly known as: TOPROL-XL Take 25 mg by mouth at bedtime.   trimethoprim 100 MG tablet Commonly known as: TRIMPEX Take 1 tablet (100 mg total) by mouth daily.       Allergies:  Allergies  Allergen Reactions  . Doxycycline Nausea Only  . Penicillins Rash and Other (See Comments)    Has patient had a PCN reaction causing immediate rash, facial/tongue/throat swelling, SOB or lightheadedness with hypotension: No Has patient had a PCN reaction causing severe rash involving mucus membranes or skin necrosis: No Has patient had a PCN reaction that required hospitalization: No Has patient had a PCN reaction occurring within the last 10 years: No If all of the above answers are "NO", then may proceed  with Cephalosporin use.     Family History: Family History  Problem Relation Age of Onset  . Diabetes Father   . Hypercholesterolemia Mother   . Breast cancer Maternal Aunt        50's late  . Breast cancer Paternal Aunt        Great PAT late 21's    Social History:  reports that she has never smoked. She has never used smokeless tobacco. She reports current alcohol use. She reports that she does not use drugs.  ROS: For pertinent review of systems please refer to history of present illness  Physical Exam: BP 106/63   Pulse 80   Ht 5\' 3"  (1.6 m)   Wt 112 lb (50.8 kg)   BMI 19.84 kg/m   Constitutional:  Well nourished. Alert and oriented, No acute distress. HEENT: [place] AT, mask in place Trachea midline, no masses. Cardiovascular: No clubbing, cyanosis, or edema. Respiratory: Normal respiratory effort, no increased work of breathing. Neurologic: Grossly intact, no focal deficits, moving all 4 extremities. Psychiatric: Normal mood and affect.  Laboratory Data: Lab Results  Component Value Date   WBC 4.3 12/28/2011   HGB 13.0 12/28/2011   HCT 37.0 12/28/2011   MCV 90 12/28/2011   PLT 160 12/28/2011    Lab Results  Component Value Date   CREATININE 0.81 06/08/2019  Lab Results  Component Value Date   AST 16 12/27/2011   Lab Results  Component Value Date   ALT 47 12/27/2011    Urinalysis Component     Latest Ref Rng & Units 06/08/2019  Specific Gravity, UA     1.005 - 1.030 1.020  pH, UA     5.0 - 7.5 5.0  Color, UA     Yellow Straw  Appearance Ur     Clear Cloudy (A)  Leukocytes,UA     Negative 1+ (A)  Protein,UA     Negative/Trace Negative  Glucose, UA     Negative Negative  Ketones, UA     Negative 3+ (A)  RBC, UA     Negative Trace (A)  Bilirubin, UA     Negative Negative  Urobilinogen, Ur     0.2 - 1.0 mg/dL 0.2  Nitrite, UA     Negative Negative  Microscopic Examination      See below:   Component     Latest Ref Rng & Units  06/08/2019  WBC, UA     0 - 5 /hpf >30 (A)  RBC     0 - 2 /hpf 0-2  Epithelial Cells (non renal)     0 - 10 /hpf >10 (A)  Bacteria, UA     None seen/Few Moderate (A)     I have reviewed the labs.   Pertinent Imaging: Results for AYNA, OROARK (MRN [id number]) as of 06/17/2019 07:54  Ref. Range 06/08/2019 11:38  Scan Result Unknown 0    Assessment & Plan:    1. Dysuria - Urinalysis, Complete - Bladder Scan (Post Void Residual) in office - BUN+Creat - patient concerned about her kidney function - Urine culture - to rule out any resistant organisms since she has started an antibiotic already - will contact patient with results and adjust antibiotic if necessary - Once she completes her course of Cipro, she will resume post-coital trimethoprim   Return for pending urine culture .  These notes generated with voice recognition software. I apologize for typographical errors.  Zara Council, PA-C  [facility] [address]  [place], [place] [address] [phone]

## 2019-09-20 ENCOUNTER — Other Ambulatory Visit: Payer: Self-pay

## 2019-09-20 ENCOUNTER — Encounter (INDEPENDENT_AMBULATORY_CARE_PROVIDER_SITE_OTHER): Payer: Self-pay | Admitting: Vascular Surgery

## 2019-09-20 ENCOUNTER — Ambulatory Visit (INDEPENDENT_AMBULATORY_CARE_PROVIDER_SITE_OTHER): Payer: 59 | Admitting: Vascular Surgery

## 2019-09-20 DIAGNOSIS — M7989 Other specified soft tissue disorders: Secondary | ICD-10-CM | POA: Diagnosis not present

## 2019-09-20 NOTE — Assessment & Plan Note (Signed)
I have had a long discussion with the patient regarding swelling and why it  causes symptoms.  Patient will begin wearing graduated compression stockings class 1 (20-30 mmHg) on a daily basis a prescription was given. The patient will  beginning wearing the stockings first thing in the morning and removing them in the evening. The patient is instructed specifically not to sleep in the stockings.   In addition, behavioral modification will be initiated.  This will include frequent elevation, use of over the counter pain medications and exercise such as walking.  I have reviewed systemic causes for chronic edema such as liver, kidney and cardiac etiologies.  The patient denies problems with these organ systems.    Consideration for a lymph pump will also be made based upon the effectiveness of conservative therapy.  This would help to improve the edema control and prevent sequela such as ulcers and infections   Patient should undergo duplex ultrasound of the venous system to ensure that DVT or reflux is not present.  The patient will follow-up with me after the ultrasound.  Given her previous urologic history of spinal fusion as well as previous diagnosis of meningitis, I would think some degree of lymphedema is likely present at this point.  We discussed the lymphedema pump would likely be an excellent adjuvant therapy going forward, but obviously need to work her up for venous disease first.

## 2019-09-20 NOTE — Progress Notes (Signed)
Patient ID: Nicole Yu, female   DOB: 10-18-1980, 39 y.o.   MRN: [id number]  Chief Complaint  Patient presents with  . New Patient (Initial Visit)    Edema LE    HPI Nicole Yu is a 39 y.o. female.  I am asked to see the patient by Dr. Saralyn Pilar for evaluation of left leg swelling.  This has been going on now for about 2 years.  She has several previous medical issues despite her young age and overall good health.  She had meningitis about 8 years ago after some sort of post rabies vaccine.  She is also had some urologic surgery as well as a spinal fusion in the past decade.  She is fit and not overweight.  She has no right leg symptoms.  Her left leg swells intermittently but it has become more frequent and more progressive.  She does get pitting edema.  She has had a previous DVT ultrasound a while back which was negative but no true venous work-up.  She does try to elevate her legs which helps.  She does not have a known history of DVT or superficial thrombophlebitis to her knowledge.  She is on Lopressor for palpitations.   Past Medical History:  Diagnosis Date  . Heart palpitations   . Migraines   . Panic attacks     Past Surgical History:  Procedure Laterality Date  . BLADDER SURGERY  age 75  . CYSTOGRAM N/A 02/17/2018   Procedure: CYSTOGRAM;  Surgeon: Hollice Espy, MD;  Location: [facility];  Service: Urology;  Laterality: N/A;  . CYSTOSCOPY N/A 02/17/2018   Procedure: CYSTOSCOPY;  Surgeon: Hollice Espy, MD;  Location: [facility];  Service: Urology;  Laterality: N/A;  . CYSTOSCOPY/RETROGRADE/URETEROSCOPY Left 02/17/2018   Procedure: CYSTOSCOPY/RETROGRADE/URETEROSCOPY;  Surgeon: Hollice Espy, MD;  Location: [facility];  Service: Urology;  Laterality: Left;  . LUMBAR FUSION    . SPINAL FUSION  2011   L4-5  . TONSILLECTOMY    . TUBAL LIGATION    . TUBAL LIGATION       Family History  Problem Relation Age of Onset  . Diabetes Father   . Hypercholesterolemia  Mother   . Breast cancer Maternal Aunt        50's late  . Breast cancer Paternal Margot Chimes PAT late 59's      Social History   Tobacco Use  . Smoking status: Never Smoker  . Smokeless tobacco: Never Used  Substance Use Topics  . Alcohol use: Yes    Comment: social  . Drug use: No     Allergies  Allergen Reactions  . Doxycycline Nausea Only  . Penicillins Rash and Other (See Comments)    Has patient had a PCN reaction causing immediate rash, facial/tongue/throat swelling, SOB or lightheadedness with hypotension: No Has patient had a PCN reaction causing severe rash involving mucus membranes or skin necrosis: No Has patient had a PCN reaction that required hospitalization: No Has patient had a PCN reaction occurring within the last 10 years: No If all of the above answers are "NO", then may proceed with Cephalosporin use.     Current Outpatient Medications  Medication Sig Dispense Refill  . aspirin-acetaminophen-caffeine (EXCEDRIN MIGRAINE) 250-250-65 MG tablet Take 2 tablets by mouth daily.    . furosemide (LASIX) 20 MG tablet Take 1/2 tablet (10 mg) once daily as needed for lower extremity swelling.    Marland Kitchen levonorgestrel (MIRENA) 20  MCG/24HR IUD 1 each by Intrauterine route once.    Marland Kitchen Lysine 500 MG TABS Take 1,000-1,500 mg by mouth daily as needed (for fever blister).    . metoprolol succinate (TOPROL-XL) 25 MG 24 hr tablet Take 25 mg by mouth at bedtime.     . ciprofloxacin (CIPRO) 500 MG tablet Take 1 tablet (500 mg total) by mouth every 12 (twelve) hours. (Patient not taking: Reported on 09/20/2019) 10 tablet 0  . trimethoprim (TRIMPEX) 100 MG tablet Take 1 tablet (100 mg total) by mouth daily. (Patient not taking: Reported on 09/20/2019) 90 tablet 1   No current facility-administered medications for this visit.      REVIEW OF SYSTEMS (Negative unless checked)  Constitutional: [] Weight loss  [] Fever  [] Chills Cardiac: [] Chest pain   [] Chest pressure    [x] Palpitations   [] Shortness of breath when laying flat   [] Shortness of breath at rest   [] Shortness of breath with exertion. Vascular:  [x] Pain in legs with walking   [x] Pain in legs at rest   [] Pain in legs when laying flat   [] Claudication   [] Pain in feet when walking  [] Pain in feet at rest  [] Pain in feet when laying flat   [] History of DVT   [] Phlebitis   [x] Swelling in legs   [] Varicose veins   [] Non-healing ulcers Pulmonary:   [] Uses home oxygen   [] Productive cough   [] Hemoptysis   [] Wheeze  [] COPD   [] Asthma Neurologic:  [] Dizziness  [] Blackouts   [] Seizures   [] History of stroke   [] History of TIA  [] Aphasia   [] Temporary blindness   [] Dysphagia   [] Weakness or numbness in arms   [] Weakness or numbness in legs Musculoskeletal:  [] Arthritis   [] Joint swelling   [] Joint pain   [] Low back pain Hematologic:  [] Easy bruising  [] Easy bleeding   [] Hypercoagulable state   [] Anemic  [] Hepatitis Gastrointestinal:  [] Blood in stool   [] Vomiting blood  [] Gastroesophageal reflux/heartburn   [] Abdominal pain Genitourinary:  [] Chronic kidney disease   [] Difficult urination  [] Frequent urination  [] Burning with urination   [] Hematuria Skin:  [] Rashes   [] Ulcers   [] Wounds Psychological:  [] History of anxiety   []  History of major depression.    Physical Exam BP 121/74   Pulse 72   Ht 5\' 3"  (1.6 m)   Wt 115 lb (52.2 kg)   BMI 20.37 kg/m  Gen:  WD/WN, NAD Head: [place]/AT, No temporalis wasting.  Ear/Nose/Throat: Hearing grossly intact, nares w/o erythema or drainage, oropharynx w/o Erythema/Exudate Eyes: Conjunctiva clear, sclera non-icteric  Neck: trachea midline.  No JVD.  Pulmonary:  Good air movement, respirations not labored, no use of accessory muscles  Cardiac: RRR, no JVD Vascular:  Vessel Right Left  Radial Palpable Palpable                          DP  palpable  palpable  PT  palpable  palpable   Gastrointestinal:. No masses, surgical incisions, or scars. Musculoskeletal:  M/S 5/5 throughout.  Extremities without ischemic changes.  No deformity or atrophy.  Trace left lower extremity edema. Neurologic: Sensation grossly intact in extremities.  Symmetrical.  Speech is fluent. Motor exam as listed above. Psychiatric: Judgment intact, Mood & affect appropriate for pt's clinical situation. Dermatologic: No rashes or ulcers noted.  No cellulitis or open wounds.    Radiology No results found.  Labs No results found for this or any previous visit (from the past 2160  hour(s)).  Assessment/Plan:  Swelling of limb I have had a long discussion with the patient regarding swelling and why it  causes symptoms.  Patient will begin wearing graduated compression stockings class 1 (20-30 mmHg) on a daily basis a prescription was given. The patient will  beginning wearing the stockings first thing in the morning and removing them in the evening. The patient is instructed specifically not to sleep in the stockings.   In addition, behavioral modification will be initiated.  This will include frequent elevation, use of over the counter pain medications and exercise such as walking.  I have reviewed systemic causes for chronic edema such as liver, kidney and cardiac etiologies.  The patient denies problems with these organ systems.    Consideration for a lymph pump will also be made based upon the effectiveness of conservative therapy.  This would help to improve the edema control and prevent sequela such as ulcers and infections   Patient should undergo duplex ultrasound of the venous system to ensure that DVT or reflux is not present.  The patient will follow-up with me after the ultrasound.  Given her previous urologic history of spinal fusion as well as previous diagnosis of meningitis, I would think some degree of lymphedema is likely present at this point.  We discussed the lymphedema pump would likely be an excellent adjuvant therapy going forward, but obviously need to  work her up for venous disease first.      Leotis Pain 09/20/2019, 10:42 AM   This note was created with Dragon medical transcription system.  Any errors from dictation are unintentional.

## 2019-09-20 NOTE — Patient Instructions (Signed)
Edema  Edema is when you have too much fluid in your body or under your skin. Edema may make your legs, feet, and ankles swell up. Swelling is also common in looser tissues, like around your eyes. This is a common condition. It gets more common as you get older. There are many possible causes of edema. Eating too much salt (sodium) and being on your feet or sitting for a long time can cause edema in your legs, feet, and ankles. Hot weather may make edema worse. Edema is usually painless. Your skin may look swollen or shiny. Follow these instructions at home:  Keep the swollen body part raised (elevated) above the level of your heart when you are sitting or lying down.  Do not sit still or stand for a long time.  Do not wear tight clothes. Do not wear garters on your upper legs.  Exercise your legs. This can help the swelling go down.  Wear elastic bandages or support stockings as told by your doctor.  Eat a low-salt (low-sodium) diet to reduce fluid as told by your doctor.  Depending on the cause of your swelling, you may need to limit how much fluid you drink (fluid restriction).  Take over-the-counter and prescription medicines only as told by your doctor. Contact a doctor if:  Treatment is not working.  You have heart, liver, or kidney disease and have symptoms of edema.  You have sudden and unexplained weight gain. Get help right away if:  You have shortness of breath or chest pain.  You cannot breathe when you lie down.  You have pain, redness, or warmth in the swollen areas.  You have heart, liver, or kidney disease and get edema all of a sudden.  You have a fever and your symptoms get worse all of a sudden. Summary  Edema is when you have too much fluid in your body or under your skin.  Edema may make your legs, feet, and ankles swell up. Swelling is also common in looser tissues, like around your eyes.  Raise (elevate) the swollen body part above the level of your  heart when you are sitting or lying down.  Follow your doctor's instructions about diet and how much fluid you can drink (fluid restriction). This information is not intended to replace advice given to you by your health care provider. Make sure you discuss any questions you have with your health care provider. Document Revised: 04/03/2017 Document Reviewed: 04/18/2016 Elsevier Patient Education  2020 [employer]  

## 2019-11-01 ENCOUNTER — Other Ambulatory Visit: Payer: Self-pay

## 2019-11-01 ENCOUNTER — Ambulatory Visit (INDEPENDENT_AMBULATORY_CARE_PROVIDER_SITE_OTHER): Payer: 59

## 2019-11-01 ENCOUNTER — Ambulatory Visit (INDEPENDENT_AMBULATORY_CARE_PROVIDER_SITE_OTHER): Payer: 59 | Admitting: Vascular Surgery

## 2019-11-01 ENCOUNTER — Encounter (INDEPENDENT_AMBULATORY_CARE_PROVIDER_SITE_OTHER): Payer: Self-pay | Admitting: Vascular Surgery

## 2019-11-01 DIAGNOSIS — R600 Localized edema: Secondary | ICD-10-CM | POA: Diagnosis not present

## 2019-11-01 DIAGNOSIS — M7989 Other specified soft tissue disorders: Secondary | ICD-10-CM

## 2019-11-01 DIAGNOSIS — I890 Lymphedema, not elsewhere classified: Secondary | ICD-10-CM

## 2019-11-01 NOTE — Assessment & Plan Note (Signed)
Venous reflux study as well as iliac vein evaluation for potential May Thurner syndrome was performed today.  No evidence of venous disease was seen in the left lower extremity in terms of thrombosis, reflux, or obstruction with normal venous flow throughout the left lower extremity including the left pelvis veins with no evidence of May Thurner syndrome by duplex. The patient does not appear to have significant venous disease.  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  I would recommend the addition of a lymphedema pump as an adjuvant therapy to improve her symptoms in addition to continued compression stockings, elevation, and vigorous activity. She is agreeable to this. We will check her back in 4-6 months.

## 2019-11-01 NOTE — Patient Instructions (Signed)

## 2019-11-01 NOTE — Progress Notes (Signed)
MRN : [id number]  Nicole Yu is a 39 y.o. (04-24-1980) female who presents with chief complaint of  Chief Complaint  Patient presents with  . Follow-up    ultrasound  .  History of Present Illness: Patient returns today in follow up of her leg swelling.  She has been diligently wearing her compression stockings and elevating her leg since her initial visit earlier this year.  This has resulted in improvement although she still notices swelling in her legs after long days at work.  She does not wear her compression stockings for a day, the swelling becomes very prominent.  Activity has also helped her swelling and she is an active and healthy lady.  No new ulceration or infection.  Venous reflux study as well as iliac vein evaluation for potential May Thurner syndrome was performed today.  No evidence of venous disease was seen in the left lower extremity in terms of thrombosis, reflux, or obstruction with normal venous flow throughout the left lower extremity including the left pelvis veins with no evidence of May Thurner syndrome by duplex.  Current Outpatient Medications  Medication Sig Dispense Refill  . aspirin-acetaminophen-caffeine (EXCEDRIN MIGRAINE) 250-250-65 MG tablet Take 2 tablets by mouth daily.    . furosemide (LASIX) 20 MG tablet Take 1/2 tablet (10 mg) once daily as needed for lower extremity swelling.    Marland Kitchen levonorgestrel (MIRENA) 20 MCG/24HR IUD 1 each by Intrauterine route once.    Marland Kitchen Lysine 500 MG TABS Take 1,000-1,500 mg by mouth daily as needed (for fever blister).    . metoprolol succinate (TOPROL-XL) 25 MG 24 hr tablet Take 25 mg by mouth at bedtime.     Marland Kitchen trimethoprim (TRIMPEX) 100 MG tablet Take 1 tablet (100 mg total) by mouth daily. 90 tablet 1  . ciprofloxacin (CIPRO) 500 MG tablet Take 1 tablet (500 mg total) by mouth every 12 (twelve) hours. (Patient not taking: Reported on 09/20/2019) 10 tablet 0   No current facility-administered medications for this visit.      Past Medical History:  Diagnosis Date  . Heart palpitations   . Migraines   . Panic attacks     Past Surgical History:  Procedure Laterality Date  . BLADDER SURGERY  age 6  . CYSTOGRAM N/A 02/17/2018   Procedure: CYSTOGRAM;  Surgeon: Hollice Espy, MD;  Location: [facility];  Service: Urology;  Laterality: N/A;  . CYSTOSCOPY N/A 02/17/2018   Procedure: CYSTOSCOPY;  Surgeon: Hollice Espy, MD;  Location: [facility];  Service: Urology;  Laterality: N/A;  . CYSTOSCOPY/RETROGRADE/URETEROSCOPY Left 02/17/2018   Procedure: CYSTOSCOPY/RETROGRADE/URETEROSCOPY;  Surgeon: Hollice Espy, MD;  Location: [facility];  Service: Urology;  Laterality: Left;  . LUMBAR FUSION    . SPINAL FUSION  2011   L4-5  . TONSILLECTOMY    . TUBAL LIGATION    . TUBAL LIGATION       Social History   Tobacco Use  . Smoking status: Never Smoker  . Smokeless tobacco: Never Used  Vaping Use  . Vaping Use: Never used  Substance Use Topics  . Alcohol use: Yes    Comment: social  . Drug use: No      Family History  Problem Relation Age of Onset  . Diabetes Father   . Hypercholesterolemia Mother   . Breast cancer Maternal Aunt        50's late  . Breast cancer Paternal Margot Chimes PAT late 68's  Allergies  Allergen Reactions  . Doxycycline Nausea Only  . Penicillins Rash and Other (See Comments)    Has patient had a PCN reaction causing immediate rash, facial/tongue/throat swelling, SOB or lightheadedness with hypotension: No Has patient had a PCN reaction causing severe rash involving mucus membranes or skin necrosis: No Has patient had a PCN reaction that required hospitalization: No Has patient had a PCN reaction occurring within the last 10 years: No If all of the above answers are "NO", then may proceed with Cephalosporin use.     REVIEW OF SYSTEMS (Negative unless checked)  Constitutional: [] ?Weight loss  [] ?Fever  [] ?Chills Cardiac: [] ?Chest pain   [] ?Chest pressure    [x] ?Palpitations   [] ?Shortness of breath when laying flat   [] ?Shortness of breath at rest   [] ?Shortness of breath with exertion. Vascular:  [x] ?Pain in legs with walking   [x] ?Pain in legs at rest   [] ?Pain in legs when laying flat   [] ?Claudication   [] ?Pain in feet when walking  [] ?Pain in feet at rest  [] ?Pain in feet when laying flat   [] ?History of DVT   [] ?Phlebitis   [x] ?Swelling in legs   [] ?Varicose veins   [] ?Non-healing ulcers Pulmonary:   [] ?Uses home oxygen   [] ?Productive cough   [] ?Hemoptysis   [] ?Wheeze  [] ?COPD   [] ?Asthma Neurologic:  [] ?Dizziness  [] ?Blackouts   [] ?Seizures   [] ?History of stroke   [] ?History of TIA  [] ?Aphasia   [] ?Temporary blindness   [] ?Dysphagia   [] ?Weakness or numbness in arms   [] ?Weakness or numbness in legs Musculoskeletal:  [] ?Arthritis   [] ?Joint swelling   [] ?Joint pain   [] ?Low back pain Hematologic:  [] ?Easy bruising  [] ?Easy bleeding   [] ?Hypercoagulable state   [] ?Anemic  [] ?Hepatitis Gastrointestinal:  [] ?Blood in stool   [] ?Vomiting blood  [] ?Gastroesophageal reflux/heartburn   [] ?Abdominal pain Genitourinary:  [] ?Chronic kidney disease   [] ?Difficult urination  [] ?Frequent urination  [] ?Burning with urination   [] ?Hematuria Skin:  [] ?Rashes   [] ?Ulcers   [] ?Wounds Psychological:  [] ?History of anxiety   [] ? History of major depression.   Physical Examination  BP 114/71 (BP Location: Right Arm)   Pulse (!) 56   Resp 14   Ht 5\' 3"  (1.6 m)   Wt 112 lb (50.8 kg)   BMI 19.84 kg/m  Gen:  WD/WN, NAD Head: [place]/AT, No temporalis wasting. Ear/Nose/Throat: Hearing grossly intact, nares w/o erythema or drainage Eyes: Conjunctiva clear. Sclera non-icteric Neck: Supple.  Trachea midline Pulmonary:  Good air movement, no use of accessory muscles.  Cardiac: RRR, no JVD Vascular:  Vessel Right Left  Radial Palpable Palpable                          PT Palpable Palpable  DP Palpable Palpable   Gastrointestinal: soft,  non-tender/non-distended. No guarding/reflex.  Musculoskeletal: M/S 5/5 throughout.  No deformity or atrophy.  No significant lower extremity edema is present this morning. Neurologic: Sensation grossly intact in extremities.  Symmetrical.  Speech is fluent.  Psychiatric: Judgment intact, Mood & affect appropriate for pt's clinical situation. Dermatologic: No rashes or ulcers noted.  No cellulitis or open wounds.       Labs No results found for this or any previous visit (from the past 2160 hour(s)).  Radiology No results found.  Assessment/Plan  Lymphedema Venous reflux study as well as iliac vein evaluation for potential May Thurner syndrome was performed today.  No evidence of venous disease was seen  in the left lower extremity in terms of thrombosis, reflux, or obstruction with normal venous flow throughout the left lower extremity including the left pelvis veins with no evidence of May Thurner syndrome by duplex. The patient does not appear to have significant venous disease.  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  I would recommend the addition of a lymphedema pump as an adjuvant therapy to improve her symptoms in addition to continued compression stockings, elevation, and vigorous activity. She is agreeable to this. We will check her back in 4-6 months.     Leotis Pain, MD  11/01/2019 9:38 AM    This note was created with Dragon medical transcription system.  Any errors from dictation are purely unintentional

## 2019-11-11 ENCOUNTER — Telehealth (INDEPENDENT_AMBULATORY_CARE_PROVIDER_SITE_OTHER): Payer: Self-pay

## 2019-11-11 NOTE — Telephone Encounter (Signed)
This was emailed yesterday from Kern [place] at [employer].  Hi Nicole Yu all is well. Patient [name] has decided she does not want to pay her insurance deductible and has declined the pump. Just wanted to make you aware. Thank you.   [name]

## 2020-01-26 ENCOUNTER — Other Ambulatory Visit: Payer: Self-pay | Admitting: Family Medicine

## 2020-01-26 DIAGNOSIS — R1011 Right upper quadrant pain: Secondary | ICD-10-CM

## 2020-02-01 ENCOUNTER — Other Ambulatory Visit: Payer: Self-pay

## 2020-02-01 ENCOUNTER — Ambulatory Visit
Admission: RE | Admit: 2020-02-01 | Discharge: 2020-02-01 | Disposition: A | Discharge status: Home / Self Care | Payer: 59 | Source: Ambulatory Visit | Attending: Family Medicine | Admitting: Family Medicine

## 2020-02-01 DIAGNOSIS — R1011 Right upper quadrant pain: Secondary | ICD-10-CM | POA: Insufficient documentation

## 2020-03-24 ENCOUNTER — Other Ambulatory Visit: Payer: 59

## 2020-03-27 ENCOUNTER — Ambulatory Visit (INDEPENDENT_AMBULATORY_CARE_PROVIDER_SITE_OTHER): Payer: 59 | Admitting: Vascular Surgery

## 2021-05-14 ENCOUNTER — Other Ambulatory Visit: Payer: Self-pay | Admitting: Gastroenterology

## 2021-05-14 ENCOUNTER — Other Ambulatory Visit (HOSPITAL_COMMUNITY): Payer: Self-pay | Admitting: Gastroenterology

## 2021-05-14 DIAGNOSIS — R140 Abdominal distension (gaseous): Secondary | ICD-10-CM

## 2021-05-14 DIAGNOSIS — R110 Nausea: Secondary | ICD-10-CM

## 2021-05-14 DIAGNOSIS — R1013 Epigastric pain: Secondary | ICD-10-CM

## 2021-05-24 ENCOUNTER — Ambulatory Visit
Admission: RE | Admit: 2021-05-24 | Discharge: 2021-05-24 | Disposition: A | Discharge status: Home / Self Care | Payer: BC Managed Care – PPO | Source: Ambulatory Visit | Attending: Gastroenterology | Admitting: Gastroenterology

## 2021-05-24 ENCOUNTER — Other Ambulatory Visit: Payer: Self-pay

## 2021-05-24 DIAGNOSIS — R140 Abdominal distension (gaseous): Secondary | ICD-10-CM | POA: Insufficient documentation

## 2021-05-24 DIAGNOSIS — R1013 Epigastric pain: Secondary | ICD-10-CM | POA: Insufficient documentation

## 2021-05-24 DIAGNOSIS — R110 Nausea: Secondary | ICD-10-CM | POA: Insufficient documentation

## 2021-05-24 MED ORDER — IOHEXOL 300 MG/ML  SOLN
100.0000 mL | Freq: Once | INTRAMUSCULAR | Status: AC | PRN
  Administered 2021-05-24: 80 mL via INTRAVENOUS

## 2021-11-06 ENCOUNTER — Other Ambulatory Visit: Payer: Self-pay | Admitting: Obstetrics and Gynecology

## 2021-12-18 ENCOUNTER — Encounter
Admission: RE | Admit: 2021-12-18 | Discharge: 2021-12-18 | Disposition: A | Discharge status: Home / Self Care | Payer: BC Managed Care – PPO | Source: Ambulatory Visit | Attending: Obstetrics and Gynecology | Admitting: Obstetrics and Gynecology

## 2021-12-18 DIAGNOSIS — Z01818 Encounter for other preprocedural examination: Secondary | ICD-10-CM

## 2021-12-18 HISTORY — DX: Ventricular premature depolarization: I49.3

## 2021-12-18 HISTORY — DX: Other chronic cystitis without hematuria: N30.20

## 2021-12-18 HISTORY — DX: Urinary tract infection, site not specified: N39.0

## 2021-12-18 HISTORY — DX: Gastro-esophageal reflux disease without esophagitis: K21.9

## 2021-12-18 HISTORY — DX: Personal history of other specified conditions: Z87.898

## 2021-12-18 NOTE — H&P (Signed)
Nicole Yu is a 41 y.o. female presenting with Pre Op Consulting (Sign consents)   History of Present Illness: Patient with failed medical management of AUB. She has tried OCPs, Lysteda and the IUD. Nothing has completely resolved her bleeding. She has considered hysterectomy for many years and now feels prepared for this step   Workup: Pap: 10/2021 ASCUS/HPVneg  EMBx: SCANT FRAGMENTS OF  UNDERDEVELOPED ENDOMETRIAL GLANDS AND DECIDUALIZED STROMA  COMPATIBLE WITH PROGESTIN EFFECT. NO HYPERPLASIA OR  CARCINOMA.    TVUS 10/2020 Uterus=8.67 x 4.55 x 5.61cm Uterus anteverted Endometrium=3.29m Rt simple ovarian cyst=2.3cm Lt simple ovarian cyst=1.8cm No free fluid seen Fibroids seen:1)Lt posterior=1.5cm  2)fundal=1.1cm IUD seen in proper location at fundal end of endometrium   Pertinent hx:  -Hx of menorrhagia, despite Mirena (last replaced in 2019); has considered TLH in the past  -S/p BTL  -Has migraines; follows with neurology  -Frequent UTI's uses prn Trimethoprim  -Fhx of breast cancer in maternal aunt and paternal aunt    09/2018: +Left breast with small mobile firm 325mmass at 3 o'clock  --->  Dx US[place]oreaf left breast and bilateral mammo: birads2; heterogeneously bilateral symmetric benign microcalcifications, benign, recommend beginning annual screening mammography at age 89     Past Medical History:  has a past medical history of Aseptic meningitis (12/2011), GERD (gastroesophageal reflux disease), Migraine headache, Panic attacks, and Recurrent UTI.  Past Surgical History:  has a past surgical history that includes Tubal ligation (09/2011); Posterior Lumbar Spine Fusion One Level (2013); Tonsillectomy; and Ureteroplasty (1986). Family History: family history includes Ankylosing spondylitis in an other family member; Breast cancer in an other family member; Coronary Artery Disease (Blocked arteries around heart) in an other family member; Diabetes in her paternal grandmother; High  blood pressure (Hypertension) in her father; Hyperlipidemia (Elevated cholesterol) in her mother; Myocardial Infarction (Heart attack) in her maternal grandfather; Sleep apnea in her father. Social History:  reports that she has never smoked. She has never used smokeless tobacco. She reports current alcohol use. She reports that she does not use drugs. OB/GYN History:  OB History       Gravida 3   Para 2   Term 2   Preterm     AB 1   Living 2       SAB 1   IAB     Ectopic     Molar     Multiple     Live Births          Allergies: is allergic to mobic [meloxicam], amoxicillin, doxycycline, penicillins, and prednisone. Medications: Current Outpatient Medications:    FUROsemide (LASIX) 20 MG tablet, Take 1/2 tablet (10 mg) once daily as needed for lower extremity swelling., Disp: 45 tablet, Rfl: 3   levonorgestreL (MIRENA 52 MG) 20 mcg/24 hr (6 years) IUD, Insert 1 each into the uterus once Follow package directions., Disp: , Rfl:    lysine 1,000 mg Tab, Take 1,000 mg by mouth once daily as needed., Disp: , Rfl:    pantoprazole (PROTONIX) 20 MG DR tablet, Take 20 mg by mouth once daily, Disp: , Rfl:    plecanatide (TRULANCE) 3 mg tablet, Take 1 tablet (3 mg total) by mouth once daily, Disp: 30 tablet, Rfl: 11   ALPRAZolam (XANAX) 0.25 MG tablet, Take 1 tablet (0.25 mg total) by mouth once daily as needed (Anxiety). (Patient not taking: Reported on 03/18/2021), Disp: 30 tablet, Rfl: 0   aspirin-acetaminophen-caffeine (EXCEDRIN MIGRAINE) 250-250-65 mg per tablet, Take  2 tablets by mouth every morning., Disp: , Rfl:    hydrocodone-chlorpheniramine (TUSSIONEX) 10-8 mg/5 mL ER suspension, Take 5 mLs by mouth every 12 (twelve) hours as needed for Cough (Patient not taking: Reported on 05/14/2021), Disp: 70 mL, Rfl: 0   metoprolol succinate (TOPROL-XL) 25 MG XL tablet, Take 1 tablet (25 mg total) by mouth once daily (Patient not taking: Reported on 01/18/2020), Disp: 30  tablet, Rfl: 11   trimethoprim 100 mg tablet, Take 100 mg by mouth once daily (Patient not taking: Reported on 03/18/2021), Disp: , Rfl:    Review of Systems: No SOB, no palpitations or chest pain, no new lower extremity edema, no nausea or vomiting or bowel or bladder complaints. See HPI for gyn specific ROS.    Exam:   BP 120/77   Pulse 84   Ht 160 cm ('5\' 3"'$ )   Wt 57.1 kg (125 lb 12.8 oz)   BMI 22.28 kg/m    General: Patient is well-groomed, well-nourished, appears stated age in no acute distress   HEENT: head is atraumatic and normocephalic, trachea is midline, neck is supple with no palpable nodules   CV: Regular rhythm and normal heart rate, no murmur   Pulm: Clear to auscultation throughout lung fields with no wheezing, crackles, or rhonchi. No increased work of breathing   Abdomen: soft , no mass, non-tender, no rebound tenderness, no hepatomegaly   Pelvic: tanner stage 5 ,              External genitalia: vulva /labia no lesions             Urethra: no prolapse             Vagina: normal physiologic d/c, laxity in vaginal walls             Cervix: no lesions, no cervical motion tenderness, good descent, +strings visible              Uterus: normal size shape and contour, non-tender             Adnexa: no mass,  non-tender               Rectovaginal: External wnl   Endometrial biopsy: The cervix was cleaned with betadine, topical Hurriciane spray applied, and a single tooth tenaculum is applied to the anterior cervix. The Pipelle catheter was placed into the endometrial cavity. It sounds to 7 cm and adequate tissue was removed.     Impression:   The primary encounter diagnosis was Pelvic pain in female. Diagnoses of Breakthrough bleeding with IUD and Abnormal uterine bleeding (AUB) were also pertinent to this visit.   Plan:   1. Preop, AUB, Breakthrough bleeding with IUD:  -Patient returns for a preoperative discussion regarding her plans to proceed with surgical  treatment of her breakthrough bleeding with IUD and AUB by laparoscopic assisted vaginal hysterectomy with bilateral salpingectomy procedure.  We may perform a cystoscopy to evaluate the urinary tract after the procedure, if surgically indicated for uro tract integrity.    The patient and I discussed the technical aspects of the procedure including the potential for risks and complications.  These include but are not limited to the risk of infection requiring post-operative antibiotics or further procedures.  We talked about the risk of injury to adjacent organs including bladder, bowel, ureter, blood vessels or nerves.  We talked about the need to convert to an open incision.  We talked about the possible need for blood transfusion.  We talked about postop complications such as thromboembolic or cardiopulmonary complications.  All of her questions were answered.  Her preoperative exam was completed and the appropriate consents were signed. She is scheduled to undergo this procedure in the near future.

## 2021-12-18 NOTE — Patient Instructions (Signed)
Your procedure is scheduled on:12-27-21 Friday Report to the Registration Desk on the 1st floor of the [facility].Then proceed to the 2nd floor Surgery Desk To find out your arrival time, please call [phone] between 1PM - 3PM on:12-26-21 Thursday If your arrival time is 6:00 am, do not arrive prior to that time as the [facility] entrance doors do not open until 6:00 am.  REMEMBER: Instructions that are not followed completely may result in serious medical risk, up to and including death; or upon the discretion of your surgeon and anesthesiologist your surgery may need to be rescheduled.  Do not eat food after midnight the night before surgery.  No gum chewing, lozengers or hard candies.  You may however, drink CLEAR liquids up to 2 hours before you are scheduled to arrive for your surgery. Do not drink anything within 2 hours of your scheduled arrival time.  Clear liquids include: - water  - apple juice without pulp - gatorade (not RED colors) - black coffee or tea (Do NOT add milk or creamers to the coffee or tea) Do NOT drink anything that is not on this list.  TAKE THESE MEDICATIONS THE MORNING OF SURGERY WITH A SIP OF WATER: -pantoprazole (PROTONIX)  One week prior to surgery:Last dose on 12-19-21 Stop Anti-inflammatories (NSAIDS) such as Advil, Aleve, Ibuprofen, Motrin, Naproxen, Naprosyn and Aspirin based products such as Excedrin Migraine, Goodys Powder, BC Powder.You may however, take Tylenol if needed for pain up until the day of surgery.  Stop ANY OVER THE COUNTER supplements/vitamins 7 days prior to surgery (Lysine)   No Alcohol for 24 hours before or after surgery.  No Smoking including e-cigarettes for 24 hours prior to surgery.  No chewable tobacco products for at least 6 hours prior to surgery.  No nicotine patches on the day of surgery.  Do not use any "recreational" drugs for at least a week prior to your surgery.  Please be advised that the combination of  cocaine and anesthesia may have negative outcomes, up to and including death. If you test positive for cocaine, your surgery will be cancelled.  On the morning of surgery brush your teeth with toothpaste and water, you may rinse your mouth with mouthwash if you wish. Do not swallow any toothpaste or mouthwash.  Use CHG Soap as directed on instruction sheet.  Do not wear jewelry, make-up, hairpins, clips or nail polish.  Do not wear lotions, powders, or perfumes.   Do not shave body from the neck down 48 hours prior to surgery just in case you cut yourself which could leave a site for infection.  Also, freshly shaved skin may become irritated if using the CHG soap.  Contact lenses, hearing aids and dentures may not be worn into surgery.  Do not bring valuables to the hospital. [facility] is not responsible for any missing/lost belongings or valuables.   Notify your doctor if there is any change in your medical condition (cold, fever, infection).  Wear comfortable clothing (specific to your surgery type) to the hospital.  After surgery, you can help prevent lung complications by doing breathing exercises.  Take deep breaths and cough every 1-2 hours. Your doctor may order a device called an Incentive Spirometer to help you take deep breaths. When coughing or sneezing, hold a pillow firmly against your incision with both hands. This is called "splinting." Doing this helps protect your incision. It also decreases belly discomfort.  If you are being admitted to the hospital overnight,  leave your suitcase in the car. After surgery it may be brought to your room.  If you are being discharged the day of surgery, you will not be allowed to drive home. You will need a responsible adult (18 years or older) to drive you home and stay with you that night.   If you are taking public transportation, you will need to have a responsible adult (18 years or older) with you. Please confirm with your  physician that it is acceptable to use public transportation.   Please call the [facility] at [phone] if you have any questions about these instructions.  Surgery Visitation Policy:  Patients undergoing a surgery or procedure may have two family members or support persons with them as long as the person is not COVID-19 positive or experiencing its symptoms.    How to Use an Incentive Spirometer An incentive spirometer is a tool that measures how well you are filling your lungs with each breath. Learning to take long, deep breaths using this tool can help you keep your lungs clear and active. This may help to reverse or lessen your chance of developing breathing (pulmonary) problems, especially infection. You may be asked to use a spirometer: After a surgery. If you have a lung problem or a history of smoking. After a long period of time when you have been unable to move or be active. If the spirometer includes an indicator to show the highest number that you have reached, your health care provider or respiratory therapist will help you set a goal. Keep a log of your progress as told by your health care provider. What are the risks? Breathing too quickly may cause dizziness or cause you to pass out. Take your time so you do not get dizzy or light-headed. If you are in pain, you may need to take pain medicine before doing incentive spirometry. It is harder to take a deep breath if you are having pain. How to use your incentive spirometer  Sit up on the edge of your bed or on a chair. Hold the incentive spirometer so that it is in an upright position. Before you use the spirometer, breathe out normally. Place the mouthpiece in your mouth. Make sure your lips are closed tightly around it. Breathe in slowly and as deeply as you can through your mouth, causing the piston or the ball to rise toward the top of the chamber. Hold your breath for 3-5 seconds, or for as long as  possible. If the spirometer includes a coach indicator, use this to guide you in breathing. Slow down your breathing if the indicator goes above the marked areas. Remove the mouthpiece from your mouth and breathe out normally. The piston or ball will return to the bottom of the chamber. Rest for a few seconds, then repeat the steps 10 or more times. Take your time and take a few normal breaths between deep breaths so that you do not get dizzy or light-headed. Do this every 1-2 hours when you are awake. If the spirometer includes a goal marker to show the highest number you have reached (best effort), use this as a goal to work toward during each repetition. After each set of 10 deep breaths, cough a few times. This will help to make sure that your lungs are clear. If you have an incision on your chest or abdomen from surgery, place a pillow or a rolled-up towel firmly against the incision when you cough. This can  help to reduce pain while taking deep breaths and coughing. General tips When you are able to get out of bed: Walk around often. Continue to take deep breaths and cough in order to clear your lungs. Keep using the incentive spirometer until your health care provider says it is okay to stop using it. If you have been in the hospital, you may be told to keep using the spirometer at home. Contact a health care provider if: You are having difficulty using the spirometer. You have trouble using the spirometer as often as instructed. Your pain medicine is not giving enough relief for you to use the spirometer as told. You have a fever. Get help right away if: You develop shortness of breath. You develop a cough with bloody mucus from the lungs. You have fluid or blood coming from an incision site after you cough. Summary An incentive spirometer is a tool that can help you learn to take long, deep breaths to keep your lungs clear and active. You may be asked to use a spirometer after a  surgery, if you have a lung problem or a history of smoking, or if you have been inactive for a long period of time. Use your incentive spirometer as instructed every 1-2 hours while you are awake. If you have an incision on your chest or abdomen, place a pillow or a rolled-up towel firmly against your incision when you cough. This will help to reduce pain. Get help right away if you have shortness of breath, you cough up bloody mucus, or blood comes from your incision when you cough. This information is not intended to replace advice given to you by your health care provider. Make sure you discuss any questions you have with your health care provider. Document Revised: 06/20/2019 Document Reviewed: 06/20/2019 Elsevier Patient Education  [place].

## 2021-12-19 ENCOUNTER — Encounter
Admission: RE | Admit: 2021-12-19 | Discharge: 2021-12-19 | Disposition: A | Discharge status: Home / Self Care | Payer: BC Managed Care – PPO | Source: Ambulatory Visit | Attending: Obstetrics and Gynecology | Admitting: Obstetrics and Gynecology

## 2021-12-19 DIAGNOSIS — N921 Excessive and frequent menstruation with irregular cycle: Secondary | ICD-10-CM | POA: Insufficient documentation

## 2021-12-19 DIAGNOSIS — Z01812 Encounter for preprocedural laboratory examination: Secondary | ICD-10-CM | POA: Insufficient documentation

## 2021-12-19 LAB — BASIC METABOLIC PANEL
Anion gap: 5 (ref 5–15)
BUN: 23 mg/dL — ABNORMAL HIGH (ref 6–20)
CO2: 26 mmol/L (ref 22–32)
Calcium: 9.2 mg/dL (ref 8.9–10.3)
Chloride: 109 mmol/L (ref 98–111)
Creatinine, Ser: 0.83 mg/dL (ref 0.44–1.00)
GFR, Estimated: >60 mL/min (ref >60)
Glucose, Bld: 92 mg/dL (ref 70–99)
Potassium: 4 mmol/L (ref 3.5–5.1)
Sodium: 140 mmol/L (ref 135–145)

## 2021-12-19 LAB — CBC
HCT: 40.7 % (ref 36.0–46.0)
Hemoglobin: 13.8 g/dL (ref 12.0–15.0)
MCH: 30.5 pg (ref 26.0–34.0)
MCHC: 33.9 g/dL (ref 30.0–36.0)
MCV: 90 fL (ref 80.0–100.0)
Platelets: 177 10*3/uL (ref 150–400)
RBC: 4.52 MIL/uL (ref 3.87–5.11)
RDW: 11.5 % (ref 11.5–15.5)
WBC: 3.9 10*3/uL — ABNORMAL LOW (ref 4.0–10.5)
nRBC: 0 % (ref 0.0–0.2)

## 2021-12-19 LAB — TYPE AND SCREEN
ABO/RH(D): A POS
Antibody Screen: NEGATIVE

## 2021-12-26 ENCOUNTER — Other Ambulatory Visit: Payer: BC Managed Care – PPO

## 2021-12-26 MED ORDER — ACETAMINOPHEN 500 MG PO TABS: 1000.0000 mg | ORAL_TABLET | ORAL | Status: AC

## 2021-12-26 MED ORDER — POVIDONE-IODINE 10 % EX SWAB: 2.0000 | Freq: Once | CUTANEOUS | Status: DC

## 2021-12-26 MED ORDER — ORAL CARE MOUTH RINSE: 15.0000 mL | Freq: Once | OROMUCOSAL | Status: AC

## 2021-12-26 MED ORDER — CEFAZOLIN SODIUM-DEXTROSE 2-4 GM/100ML-% IV SOLN
2.0000 g | INTRAVENOUS | Status: AC
  Administered 2021-12-27: 2 g via INTRAVENOUS

## 2021-12-26 MED ORDER — GABAPENTIN 300 MG PO CAPS: 300.0000 mg | ORAL_CAPSULE | ORAL | Status: AC

## 2021-12-26 MED ORDER — LACTATED RINGERS IV SOLN: INTRAVENOUS | Status: DC

## 2021-12-26 MED ORDER — CHLORHEXIDINE GLUCONATE 0.12 % MT SOLN: 15.0000 mL | Freq: Once | OROMUCOSAL | Status: AC

## 2021-12-27 ENCOUNTER — Other Ambulatory Visit: Payer: Self-pay

## 2021-12-27 ENCOUNTER — Ambulatory Visit
Admission: RE | Admit: 2021-12-27 | Discharge: 2021-12-27 | Disposition: A | Discharge status: Home / Self Care | Payer: BC Managed Care – PPO | Source: Ambulatory Visit | Attending: Obstetrics and Gynecology | Admitting: Obstetrics and Gynecology

## 2021-12-27 ENCOUNTER — Encounter
Admission: RE | Disposition: A | Discharge status: Home / Self Care | Payer: Self-pay | Source: Ambulatory Visit | Attending: Obstetrics and Gynecology

## 2021-12-27 ENCOUNTER — Encounter: Payer: Self-pay | Admitting: Obstetrics and Gynecology

## 2021-12-27 ENCOUNTER — Ambulatory Visit: Payer: BC Managed Care – PPO | Admitting: Urgent Care

## 2021-12-27 ENCOUNTER — Ambulatory Visit: Payer: BC Managed Care – PPO | Admitting: Anesthesiology

## 2021-12-27 DIAGNOSIS — N838 Other noninflammatory disorders of ovary, fallopian tube and broad ligament: Secondary | ICD-10-CM | POA: Diagnosis not present

## 2021-12-27 DIAGNOSIS — N939 Abnormal uterine and vaginal bleeding, unspecified: Secondary | ICD-10-CM | POA: Diagnosis present

## 2021-12-27 DIAGNOSIS — K219 Gastro-esophageal reflux disease without esophagitis: Secondary | ICD-10-CM | POA: Insufficient documentation

## 2021-12-27 DIAGNOSIS — F419 Anxiety disorder, unspecified: Secondary | ICD-10-CM | POA: Insufficient documentation

## 2021-12-27 DIAGNOSIS — D259 Leiomyoma of uterus, unspecified: Secondary | ICD-10-CM | POA: Diagnosis not present

## 2021-12-27 DIAGNOSIS — Z01818 Encounter for other preprocedural examination: Secondary | ICD-10-CM

## 2021-12-27 DIAGNOSIS — N921 Excessive and frequent menstruation with irregular cycle: Secondary | ICD-10-CM

## 2021-12-27 HISTORY — PX: LAPAROSCOPIC VAGINAL HYSTERECTOMY WITH SALPINGECTOMY: SHX6680

## 2021-12-27 LAB — POCT PREGNANCY, URINE: Preg Test, Ur: NEGATIVE

## 2021-12-27 LAB — ABO/RH: ABO/RH(D): A POS

## 2021-12-27 SURGERY — HYSTERECTOMY, VAGINAL, LAPAROSCOPY-ASSISTED, WITH SALPINGECTOMY
Anesthesia: General | Site: Pelvis | Laterality: Bilateral

## 2021-12-27 MED ORDER — ROCURONIUM BROMIDE 100 MG/10ML IV SOLN
INTRAVENOUS | Status: DC | PRN
  Administered 2021-12-27: 20 mg via INTRAVENOUS
  Administered 2021-12-27 (×2): 50 mg via INTRAVENOUS

## 2021-12-27 MED ORDER — MIDAZOLAM HCL 2 MG/2ML IJ SOLN
INTRAMUSCULAR | Status: AC
  Filled 2021-12-27: qty 2

## 2021-12-27 MED ORDER — ACETAMINOPHEN EXTRA STRENGTH 500 MG PO TABS: 1000.0000 mg | ORAL_TABLET | Freq: Four times a day (QID) | ORAL | 0 refills | Status: AC

## 2021-12-27 MED ORDER — SUGAMMADEX SODIUM 200 MG/2ML IV SOLN
INTRAVENOUS | Status: DC | PRN
  Administered 2021-12-27: 200 mg via INTRAVENOUS

## 2021-12-27 MED ORDER — FENTANYL CITRATE (PF) 100 MCG/2ML IJ SOLN
INTRAMUSCULAR | Status: AC
  Administered 2021-12-27: 25 ug via INTRAVENOUS
  Filled 2021-12-27: qty 2

## 2021-12-27 MED ORDER — PROPOFOL 1000 MG/100ML IV EMUL
INTRAVENOUS | Status: AC
  Filled 2021-12-27: qty 100

## 2021-12-27 MED ORDER — DEXAMETHASONE SODIUM PHOSPHATE 10 MG/ML IJ SOLN
INTRAMUSCULAR | Status: DC | PRN
  Administered 2021-12-27: 10 mg via INTRAVENOUS

## 2021-12-27 MED ORDER — PROPOFOL 10 MG/ML IV BOLUS
INTRAVENOUS | Status: DC | PRN
  Administered 2021-12-27: 150 mg via INTRAVENOUS

## 2021-12-27 MED ORDER — BUPIVACAINE HCL 0.5 % IJ SOLN
INTRAMUSCULAR | Status: DC | PRN
  Administered 2021-12-27: 9 mL

## 2021-12-27 MED ORDER — ONDANSETRON HCL 4 MG/2ML IJ SOLN: 4.0000 mg | Freq: Once | INTRAMUSCULAR | Status: DC | PRN

## 2021-12-27 MED ORDER — LIDOCAINE HCL (CARDIAC) PF 100 MG/5ML IV SOSY
PREFILLED_SYRINGE | INTRAVENOUS | Status: DC | PRN
  Administered 2021-12-27: 50 mg via INTRAVENOUS

## 2021-12-27 MED ORDER — CHLORHEXIDINE GLUCONATE 0.12 % MT SOLN
OROMUCOSAL | Status: AC
  Administered 2021-12-27: 15 mL via OROMUCOSAL
  Filled 2021-12-27: qty 15

## 2021-12-27 MED ORDER — PHENYLEPHRINE HCL (PRESSORS) 10 MG/ML IV SOLN
INTRAVENOUS | Status: DC | PRN
  Administered 2021-12-27 (×4): 80 ug via INTRAVENOUS

## 2021-12-27 MED ORDER — FENTANYL CITRATE (PF) 100 MCG/2ML IJ SOLN
INTRAMUSCULAR | Status: DC | PRN
  Administered 2021-12-27 (×2): 50 ug via INTRAVENOUS

## 2021-12-27 MED ORDER — MIDAZOLAM HCL 2 MG/2ML IJ SOLN
INTRAMUSCULAR | Status: DC | PRN
  Administered 2021-12-27: 2 mg via INTRAVENOUS

## 2021-12-27 MED ORDER — ACETAMINOPHEN 500 MG PO TABS
ORAL_TABLET | ORAL | Status: AC
  Administered 2021-12-27: 1000 mg via ORAL
  Filled 2021-12-27: qty 2

## 2021-12-27 MED ORDER — 0.9 % SODIUM CHLORIDE (POUR BTL) OPTIME
TOPICAL | Status: DC | PRN
  Administered 2021-12-27: 1000 mL

## 2021-12-27 MED ORDER — GABAPENTIN 800 MG PO TABS: 800.0000 mg | ORAL_TABLET | Freq: Every day | ORAL | 0 refills | Status: AC

## 2021-12-27 MED ORDER — LIDOCAINE-EPINEPHRINE 1 %-1:100000 IJ SOLN
INTRAMUSCULAR | Status: DC | PRN
  Administered 2021-12-27: 14 mL

## 2021-12-27 MED ORDER — FENTANYL CITRATE (PF) 100 MCG/2ML IJ SOLN
INTRAMUSCULAR | Status: AC
  Filled 2021-12-27: qty 2

## 2021-12-27 MED ORDER — IBUPROFEN 800 MG PO TABS: 800.0000 mg | ORAL_TABLET | Freq: Three times a day (TID) | ORAL | 1 refills | Status: AC

## 2021-12-27 MED ORDER — OXYCODONE HCL 5 MG PO TABS: 5.0000 mg | ORAL_TABLET | ORAL | 0 refills | Status: DC | PRN

## 2021-12-27 MED ORDER — DOCUSATE SODIUM 100 MG PO CAPS: 100.0000 mg | ORAL_CAPSULE | Freq: Two times a day (BID) | ORAL | 0 refills | Status: AC

## 2021-12-27 MED ORDER — CEFAZOLIN SODIUM-DEXTROSE 2-4 GM/100ML-% IV SOLN
INTRAVENOUS | Status: AC
  Filled 2021-12-27: qty 100

## 2021-12-27 MED ORDER — GABAPENTIN 300 MG PO CAPS
ORAL_CAPSULE | ORAL | Status: AC
  Filled 2021-12-27: qty 1

## 2021-12-27 MED ORDER — OXYCODONE HCL 5 MG PO TABS
5.0000 mg | ORAL_TABLET | ORAL | Status: DC | PRN
  Administered 2021-12-27: 5 mg via ORAL

## 2021-12-27 MED ORDER — LACTATED RINGERS IV SOLN: INTRAVENOUS | Status: DC | PRN

## 2021-12-27 MED ORDER — FENTANYL CITRATE (PF) 100 MCG/2ML IJ SOLN
25.0000 ug | INTRAMUSCULAR | Status: DC | PRN
  Administered 2021-12-27 (×5): 25 ug via INTRAVENOUS

## 2021-12-27 MED ORDER — OXYCODONE HCL 5 MG PO TABS
ORAL_TABLET | ORAL | Status: AC
  Filled 2021-12-27: qty 1

## 2021-12-27 MED ORDER — GABAPENTIN 300 MG PO CAPS
ORAL_CAPSULE | ORAL | Status: AC
  Administered 2021-12-27: 300 mg via ORAL
  Filled 2021-12-27: qty 1

## 2021-12-27 MED ORDER — BUPIVACAINE HCL (PF) 0.5 % IJ SOLN
INTRAMUSCULAR | Status: AC
  Filled 2021-12-27: qty 30

## 2021-12-27 MED ORDER — ONDANSETRON HCL 4 MG/2ML IJ SOLN
INTRAMUSCULAR | Status: DC | PRN
  Administered 2021-12-27: 4 mg via INTRAVENOUS

## 2021-12-27 MED ORDER — LIDOCAINE-EPINEPHRINE 1 %-1:100000 IJ SOLN
INTRAMUSCULAR | Status: AC
  Filled 2021-12-27: qty 1

## 2021-12-27 SURGICAL SUPPLY — 61 items
ADH SKN CLS APL DERMABOND .7 (GAUZE/BANDAGES/DRESSINGS) ×1
APL SRG 38 LTWT LNG FL B (MISCELLANEOUS) ×1
APPLICATOR ARISTA FLEXITIP XL (MISCELLANEOUS) IMPLANT
BAG DRN RND TRDRP ANRFLXCHMBR (UROLOGICAL SUPPLIES) ×1
BAG URINE DRAIN 2000ML AR STRL (UROLOGICAL SUPPLIES) ×1 IMPLANT
BLADE SURG SZ11 CARB STEEL (BLADE) ×1 IMPLANT
CATH FOLEY 2WAY  5CC 16FR (CATHETERS) ×1
CATH FOLEY 2WAY 5CC 16FR (CATHETERS) ×1
CATH URTH 16FR FL 2W BLN LF (CATHETERS) ×1 IMPLANT
DERMABOND ADVANCED .7 DNX12 (GAUZE/BANDAGES/DRESSINGS) IMPLANT
DRAPE GENERAL ENDO 106X123.5 (DRAPES) ×1 IMPLANT
ELECT REM PT RETURN 9FT ADLT (ELECTROSURGICAL) ×1
ELECTRODE REM PT RTRN 9FT ADLT (ELECTROSURGICAL) ×1 IMPLANT
GAUZE 4X4 16PLY ~~LOC~~+RFID DBL (SPONGE) ×3 IMPLANT
GLOVE BIO SURGEON STRL SZ7 (GLOVE) ×3 IMPLANT
GLOVE SURG UNDER LTX SZ7.5 (GLOVE) ×3 IMPLANT
GOWN STRL REUS W/ TWL LRG LVL3 (GOWN DISPOSABLE) ×3 IMPLANT
GOWN STRL REUS W/ TWL XL LVL3 (GOWN DISPOSABLE) ×1 IMPLANT
GOWN STRL REUS W/TWL LRG LVL3 (GOWN DISPOSABLE) ×3
GOWN STRL REUS W/TWL XL LVL3 (GOWN DISPOSABLE) ×1
HEMOSTAT ARISTA ABSORB 3G PWDR (HEMOSTASIS) IMPLANT
IRRIGATION STRYKERFLOW (MISCELLANEOUS) ×1 IMPLANT
IRRIGATOR STRYKERFLOW (MISCELLANEOUS)
IV LACTATED RINGERS 1000ML (IV SOLUTION) ×1 IMPLANT
KIT TURNOVER CYSTO (KITS) ×1 IMPLANT
L-HOOK LAP DISP 36CM (ELECTROSURGICAL)
LABEL OR SOLS (LABEL) ×1 IMPLANT
LHOOK LAP DISP 36CM (ELECTROSURGICAL) IMPLANT
LIGASURE LAP MARYLAND 5MM 37CM (ELECTROSURGICAL) IMPLANT
LIGASURE VESSEL 5MM BLUNT TIP (ELECTROSURGICAL) ×1 IMPLANT
MANIFOLD NEPTUNE II (INSTRUMENTS) ×1 IMPLANT
MANIPULATOR VCARE LG CRV RETR (MISCELLANEOUS) IMPLANT
MANIPULATOR VCARE SML CRV RETR (MISCELLANEOUS) IMPLANT
MANIPULATOR VCARE STD CRV RETR (MISCELLANEOUS) IMPLANT
NEEDLE HYPO 22GX1.5 SAFETY (NEEDLE) ×1 IMPLANT
NS IRRIG 500ML POUR BTL (IV SOLUTION) ×1 IMPLANT
OCCLUDER COLPOPNEUMO (BALLOONS) ×1 IMPLANT
PACK BASIN MINOR ARMC (MISCELLANEOUS) ×1 IMPLANT
PACK GYN LAPAROSCOPIC (MISCELLANEOUS) ×1 IMPLANT
PAD OB MATERNITY 4.3X12.25 (PERSONAL CARE ITEMS) ×1 IMPLANT
PAD PREP 24X41 OB/GYN DISP (PERSONAL CARE ITEMS) ×1 IMPLANT
SCRUB CHG 4% DYNA-HEX 4OZ (MISCELLANEOUS) ×1 IMPLANT
SET CYSTO W/LG BORE CLAMP LF (SET/KITS/TRAYS/PACK) ×1 IMPLANT
SLEEVE Z-THREAD 5X100MM (TROCAR) ×1 IMPLANT
STRIP CLOSURE SKIN 1/2X4 (GAUZE/BANDAGES/DRESSINGS) ×1 IMPLANT
STRIP CLOSURE SKIN 1/4X4 (GAUZE/BANDAGES/DRESSINGS) IMPLANT
SUT MNCRL 4-0 (SUTURE) ×2
SUT MNCRL 4-0 27XMFL (SUTURE) ×2
SUT PDS AB 2-0 CT1 27 (SUTURE) ×1 IMPLANT
SUT VIC AB 0 CT1 27 (SUTURE) ×3
SUT VIC AB 0 CT1 27XCR 8 STRN (SUTURE) ×1 IMPLANT
SUT VIC AB 0 CT1 36 (SUTURE) ×3 IMPLANT
SUT VIC AB 2-0 UR6 27 (SUTURE) ×1 IMPLANT
SUT VIC AB 4-0 FS2 27 (SUTURE) ×1 IMPLANT
SUTURE MNCRL 4-0 27XMF (SUTURE) IMPLANT
SYR 10ML LL (SYRINGE) ×1 IMPLANT
SYR CONTROL 10ML LL (SYRINGE) ×1 IMPLANT
TRAP FLUID SMOKE EVACUATOR (MISCELLANEOUS) ×1 IMPLANT
TROCAR XCEL NON-BLD 5MMX100MML (ENDOMECHANICALS) ×1 IMPLANT
TROCAR Z-THRD FIOS HNDL 11X100 (TROCAR) ×1 IMPLANT
TUBING EVAC SMOKE HEATED PNEUM (TUBING) ×1 IMPLANT

## 2021-12-27 NOTE — Anesthesia Procedure Notes (Signed)
Procedure Name: Intubation Date/Time: 12/27/2021 7:42 AM  Performed by: Beverely Low, CRNAPre-anesthesia Checklist: Patient identified, Patient being monitored, Timeout performed, Emergency Drugs available and Suction available Patient Re-evaluated:Patient Re-evaluated prior to induction Oxygen Delivery Method: Circle system utilized Preoxygenation: Pre-oxygenation with 100% oxygen Induction Type: IV induction Ventilation: Mask ventilation without difficulty Laryngoscope Size: 3 and McGraph Grade View: Grade I Tube type: Oral Tube size: 7.5 mm Number of attempts: 1 Airway Equipment and Method: Stylet Placement Confirmation: ETT inserted through vocal cords under direct vision, positive ETCO2 and breath sounds checked- equal and bilateral Secured at: 21 cm Tube secured with: Tape Dental Injury: Teeth and Oropharynx as per pre-operative assessment

## 2021-12-27 NOTE — Anesthesia Postprocedure Evaluation (Signed)
Anesthesia Post Note  Patient: Nicole Yu  Procedure(s) Performed: LAPAROSCOPIC ASSISTED VAGINAL HYSTERECTOMY WITH SALPINGECTOMY (Bilateral: Pelvis)  Patient location during evaluation: PACU Anesthesia Type: General Level of consciousness: awake and oriented Pain management: pain level controlled Vital Signs Assessment: post-procedure vital signs reviewed and stable Respiratory status: respiratory function stable and nonlabored ventilation Anesthetic complications: no   No notable events documented.   Last Vitals:  Vitals:   12/27/21 1200 12/27/21 1205  BP: (!) 99/56   Pulse: 92 76  Resp: 13 12  Temp:    SpO2: 95% 97%    Last Pain:  Vitals:   12/27/21 1205  TempSrc:   PainSc: 1                  VAN STAVEREN,Willella Harding

## 2021-12-27 NOTE — Op Note (Signed)
[name] PROCEDURE DATE: 12/27/2021   PREOPERATIVE DIAGNOSIS: AUB POSTOPERATIVE DIAGNOSIS: The same PROCEDURE: Laparoscopic assisted vaginal hysterectomy, bilateral salpingectomy SURGEON:  Dr. Angelina Pih  ASSISTANT: Prentice Docker, MD  ANESTHESIOLOGIST: [facility], Jane Canary, MD Anesthesiologist: [facility], Jane Canary, MD CRNA: Tollie Eth, CRNA; Beverely Low, CRNA  INDICATIONS: 41 y.o. F with AUB who has failed medical management, here today for definitive surgical management.   Risks of surgery were discussed with the patient including but not limited to: bleeding which may require transfusion or reoperation; infection which may require antibiotics; injury to bowel, bladder, ureters or other surrounding organs; need for additional procedures including laparotomy; thromboembolic phenomenon, incisional problems and other postoperative/anesthesia complications. Written informed consent was obtained.    FINDINGS:  Small uterus, normal adnexa bilaterally.  No evidence of endometriosis, adhesions or any other abdominal/pelvic abnormality.  Normal upper abdomen. +evidence of prior BTL. Normal appendix.  ANESTHESIA:    General INTRAVENOUS FLUIDS: 1000 ml ESTIMATED BLOOD LOSS: 50 ml SPECIMENS: Uterus, cervix, bilateral fallopian tubes COMPLICATIONS: None immediate   PROCEDURE IN DETAIL:  The patient received intravenous antibiotics and had sequential compression devices applied to her lower extremities while in the preoperative area.  She was then taken to the operating room where general anesthesia was administered and was found to be adequate.  She was placed in the dorsal lithotomy position, and was prepped and draped in a sterile manner.  A Foley catheter was inserted into her bladder and attached to constant drainage and a uterine manipulator was then advanced into the uterus.  After an adequate timeout was performed, attention was turned to the abdomen where an  umbilical incision was made with the scalpel.  A 5-mm trocar and sleeve were then advanced without difficulty with the laparoscope under direct visualization into the abdomen.  The abdomen was then insufflated with carbon dioxide gas and adequate pneumoperitoneum was obtained. Bilateral 5-mm lower quadrant ports were then placed under direct visualization.  A survey of the patient's pelvis and abdomen revealed the findings as above.  The round ligaments were clamped and transected with the Ligasure device on both sides. Tubes transected from the ovaries and the uteroovarian taken.  The uterine artery was then skeletonized and a bladder flap was created.  The ureters were noted to be safely away from the area of dissection.  The bladder was then bluntly dissected off the lower uterine segment.    At this point, attention was turned to the uterine vessels, which were clamped and cauterized using the Ligasure on the left, and then the right. After the uterine blood flow at the level of the internal os was controlled, both arteries were cut with the Ligasure.    Excellent hemostasis was noted, the decision was made to leave the trocars in place and proceed with completing the hysterectomy via the vaginal route .  Attention was then turned to her pelvis.  A weighted speculum was then placed in the vagina, and the anterior and posterior lips of the cervix were grasped bilaterally with tenaculums.  The cervix was then injected circumferentially with 1% lidocaine with 1:1000 epinephrine solution to maintain hemostasis.  The cervix was then circumferentially incised, and the anterior cul-de-sac was then entered sharply without difficulty and a retractor was placed.  The same procedure was performed posteriorly and the posterior cul-de-sac was entered sharply without difficulty.  A long weighted speculum was inserted into the posterior cul-de-sac.  The Heaney clamp was then used to clamp  the uterosacral ligaments on  either side.  They were then cut and sutured ligated with 0 Vicryl, and were held with a tag for later identification. Of note, all sutures used in this case were 0 Vicryl unless otherwise noted.   The cardinal ligaments were then clamped, cut and ligated bilaterally. The uterine vessels and broad ligaments were then serially clamped with the Heaney clamps, cut, and suture ligated on both sides.  The uterus was noted to be freed from all ligaments and was then delivered and sent to pathology.   After completion of the hysterectomy, all pedicles from the uterosacral ligament to the cornua were examined hemostasis was confirmed.   The vaginal cuff was then closed in a running locked fashion with 0 Vicryl with care given to incorporate the uterosacral pedicles bilaterally.  Attention was then returned to her abdomen which was insufflated again with carbon dioxide gas.  The laparoscope was used to survey the operative site, and it was found to be hemostatic.   No intraoperative injury to other surrounding organs was noted.  The abdomen was desufflated and all instruments were then removed from the patient's abdomen.     All skin incisions were closed with 3-0 monocryl and Dermabond. The patient tolerated the procedures well.  All instruments, needles, and sponge counts were correct x 2. The patient was taken to the recovery room awake, extubated and in stable condition.

## 2021-12-27 NOTE — Transfer of Care (Addendum)
Immediate Anesthesia Transfer of Care Note  Patient: Nicole Yu  Procedure(s) Performed: LAPAROSCOPIC ASSISTED VAGINAL HYSTERECTOMY WITH SALPINGECTOMY (Bilateral)  Patient Location: PACU  Anesthesia Type:General  Level of Consciousness: drowsy  Airway & Oxygen Therapy: Patient Spontanous Breathing and Patient connected to face mask oxygen  Post-op Assessment: Report given to RN and Post -op Vital signs reviewed and stable  Post vital signs: Reviewed and stable  Last Vitals:  Vitals Value Taken Time  BP    Temp    Pulse    Resp    SpO2      Last Pain:  Vitals:   12/27/21 0627  TempSrc: Temporal  PainSc: 0-No pain         Complications: No notable events documented.

## 2021-12-27 NOTE — Interval H&P Note (Signed)
History and Physical Interval Note:  12/27/2021 7:32 AM  Nicole Yu  has presented today for surgery, with the diagnosis of Abnormal uterine bleeding.  The various methods of treatment have been discussed with the patient and family. After consideration of risks, benefits and other options for treatment, the patient has consented to  Procedure(s): LAPAROSCOPIC ASSISTED VAGINAL HYSTERECTOMY WITH SALPINGECTOMY (Bilateral) as a surgical intervention.  The patient's history has been reviewed, patient examined, no change in status, stable for surgery.  I have reviewed the patient's chart and labs.  Questions were answered to the patient's satisfaction.     Benjaman Kindler

## 2021-12-27 NOTE — Anesthesia Preprocedure Evaluation (Signed)
Anesthesia Evaluation  Patient identified by MRN, date of birth, ID band Patient awake    Reviewed: Allergy & Precautions, NPO status , Patient's Chart, lab work & pertinent test results  Airway Mallampati: II  TM Distance: >3 FB Neck ROM: full    Dental  (+) Teeth Intact   Pulmonary neg pulmonary ROS,    Pulmonary exam normal breath sounds clear to auscultation       Cardiovascular Exercise Tolerance: Good negative cardio ROS Normal cardiovascular exam Rhythm:Regular Rate:Normal     Neuro/Psych  Headaches, Anxiety negative neurological ROS  negative psych ROS   GI/Hepatic negative GI ROS, Neg liver ROS, GERD  Medicated,  Endo/Other  negative endocrine ROS  Renal/GU negative Renal ROS  negative genitourinary   Musculoskeletal negative musculoskeletal ROS (+)   Abdominal Normal abdominal exam  (+)   Peds negative pediatric ROS (+)  Hematology negative hematology ROS (+)   Anesthesia Other Findings Past Medical History: No date: Chronic cystitis No date: Frequent UTI No date: GERD (gastroesophageal reflux disease) No date: Heart palpitations No date: History of peripheral edema No date: Migraines No date: Panic attacks No date: PVC (premature ventricular contraction)  Past Surgical History: age 41: BLADDER SURGERY 02/17/2018: CYSTOGRAM; N/A     Comment:  Procedure: CYSTOGRAM;  Surgeon: Hollice Espy, MD;                Location: [facility];  Service: Urology;  Laterality: N/A; 02/17/2018: CYSTOSCOPY; N/A     Comment:  Procedure: CYSTOSCOPY;  Surgeon: Hollice Espy, MD;                Location: [facility];  Service: Urology;  Laterality: N/A; 02/17/2018: CYSTOSCOPY/RETROGRADE/URETEROSCOPY; Left     Comment:  Procedure: CYSTOSCOPY/RETROGRADE/URETEROSCOPY;  Surgeon:              Hollice Espy, MD;  Location: [facility];  Service:               Urology;  Laterality: Left; No date: LUMBAR FUSION 2011: SPINAL  FUSION     Comment:  L4-5 No date: TONSILLECTOMY No date: TUBAL LIGATION  BMI    Body Mass Index: 22.14 kg/m      Reproductive/Obstetrics negative OB ROS                             Anesthesia Physical Anesthesia Plan  ASA: 2  Anesthesia Plan: General   Post-op Pain Management:    Induction: Intravenous  PONV Risk Score and Plan: 1 and Ondansetron and Dexamethasone  Airway Management Planned: Oral ETT  Additional Equipment:   Intra-op Plan:   Post-operative Plan: Extubation in OR  Informed Consent: I have reviewed the patients History and Physical, chart, labs and discussed the procedure including the risks, benefits and alternatives for the proposed anesthesia with the patient or authorized representative who has indicated his/her understanding and acceptance.     Dental Advisory Given  Plan Discussed with: CRNA and Surgeon  Anesthesia Plan Comments:         Anesthesia Quick Evaluation

## 2021-12-27 NOTE — Discharge Instructions (Addendum)
Discharge instructions after   total laparoscopic assisted vaginal hysterectomy   For the next three days, take ibuprofen and acetaminophen on a schedule, every 8 hours. You can take them together or you can intersperse them, and take one every four hours. I also gave you gabapentin for nighttime, to help you sleep and also to control pain. Take gabapentin medicines at night for at least the next 3 nights. You also have a narcotic, oxycodone, to take as needed if the above medicines don't help.  Postop constipation is a major cause of pain. Stay well hydrated, walk as you tolerate, and take over the counter senna as well as stool softeners if you need them.    Signs and Symptoms to Report Call our office at [phone] if you have any of the following.   Fever over 100.4 degrees or higher  Severe stomach pain not relieved with pain medications  Bright red bleeding that's heavier than a period that does not slow with rest  To go the bathroom a lot (frequency), you can't hold your urine (urgency), or it hurts when you empty your bladder (urinate)  Chest pain  Shortness of breath  Pain in the calves of your legs  Severe nausea and vomiting not relieved with anti-nausea medications  Signs of infection around your wounds, such as redness, hot to touch, swelling, green/yellow drainage (like pus), bad smelling discharge  Any concerns  What You Can Expect after Surgery  You may see some pink tinged, bloody fluid and bruising around the wound. This is normal.  You may notice shoulder and neck pain. This is caused by the gas used during surgery to expand your abdomen so your surgeon could get to the uterus easier.  You may have a sore throat because of the tube in your mouth during general anesthesia. This will go away in 2 to 3 days.  You may have some stomach cramps.  You may notice spotting on your panties.  You may have pain around the incision sites.   Activities after Your  Discharge Follow these guidelines to help speed your recovery at home:  Do the coughing and deep breathing as you did in the hospital for 2 weeks. Use the small blue breathing device, called the incentive spirometer for 2 weeks.  Don't drive if you are in pain or taking narcotic pain medicine. You may drive when you can safely slam on the brakes, turn the wheel forcefully, and rotate your torso comfortably. This is typically 1-2 weeks. Practice in a parking lot or side street prior to attempting to drive regularly.   Ask others to help with household chores for 4 weeks.  Do not lift anything heavier that 10 pounds for 4-6 weeks. This includes pets, children, and groceries.  Don't do strenuous activities, exercises, or sports like vacuuming, tennis, squash, etc. until your doctor says it is safe to do so. ---Maintain pelvic rest for 8 weeks. This means nothing in the vagina or rectum at all (no douching, tampons, intercourse) for 8 weeks.   Walk as you feel able. Rest often since it may take two or three weeks for your energy level to return to normal.   You may climb stairs  Avoid constipation:   -Eat fruits, vegetables, and whole grains. Eat small meals as your appetite will take time to return to normal.   -Drink 6 to 8 glasses of water each day unless your doctor has told you to limit your fluids.   -Use  a laxative or stool softener as needed if constipation becomes a problem. You may take Miralax, metamucil, Citrucil, Colace, Senekot, FiberCon, etc. If this does not relieve the constipation, try two tablespoons of Milk Of Magnesia every 8 hours until your bowels move.   You may shower. Gently wash the wounds with a mild soap and water. Pat dry.  Do not get in a hot tub, swimming pool, etc. for 6 weeks.  Do not use lotions, oils, powders on the wounds.  Do not douche, use tampons, or have sex until your doctor says it is okay.  Take your pain medicine when you need it. The medicine may not work  as well if the pain is bad.  Take the medicines you were taking before surgery. Other medications you will need are pain medications and possibly constipation and nausea medications (Zofran).    Here is a helpful article from the website [employer], regarding constipation  Here are reasons why constipation occurs after surgery: 1) During the operation and in the recovery room, most people are given opioid pain medication, primarily through an IV, to treat moderate or severe pain. Intravenous opioids include morphine, Dilaudid and fentanyl. After surgery, patients are often prescribed opioid pain medication to take by mouth at home, including codeine, Vicodin, Norco, and Percocet. All of these medications cause constipation by slowing down the movement of your intestine. 2) Changes in your diet before surgery can be another culprit. It is common to get specific instructions to change how you normally eat or drink before your surgery, like only having liquids the day before or not having anything to eat or drink after midnight the night before surgery. For this reason, temporary dehydration may occur. This, along with not eating or only having liquids, means that you are getting less fiber than usual. Both these factors contribute to constipation. 3) Changes in your diet after surgery can also contribute to the problem. Although many people don't have dietary restrictions after operations, being under anesthesia can make you lose your appetite for several hours and maybe even days. Some people can even have nausea or vomiting. Not eating or drinking normally means that you are not getting enough fiber and you can get dehydrated, both leading to constipation. 4) Lying in a bed more than usual--which happens before, during and after surgery--combined with the medications and diet changes, all work together to slow down your colon and make your poop turn to rock.  No one likes to be constipated.  Let's  face it, it's not a pleasant feeling when you don't poop for days, then strain on the toilet to finally pass something large enough to cause damage. An ounce of prevention is worth a pound of cure, so: Assume you will be constipated. Plan and prepare accordingly. Post-surgery is one of those unique situations where the temporary use of laxatives can make a world of difference. Always consult with your doctor, and recognize that if you wait several days after surgery to take a laxative, the constipation might be too severe for these over-the-counter options. It is always important to discuss all medications you plan on taking with your doctor. Ask your doctor if you can start the laxative immediately after surgery. *  Here are go-to post-surgery laxatives: Senna: Senna is an herb that acts as a "stimulant laxative," meaning it increases the activity of the intestine to cause you to have a bowel movement. It comes in many forms, but senna pills are easy to take and  are sold over the counter at almost all pharmacies. Since opioid pain medications slow down the activity of the intestine, it makes sense to take a medication to help reverse that side effect. Long-term use of a stimulant laxative is not a good idea since it can make your colon "lazy" and not function properly; however, temporary use immediately after surgery is acceptable. In general, if you are able to eat a normal diet, taking senna soon after surgery works the best. Senna usually works within hours to produce a bowel movement, but this is less predictable when you are taking different medications after surgery. Try not to wait several days to start taking senna, as often it is too late by then. Just like with all medications or supplements, check with your doctor before starting new treatment.   Magnesium: Magnesium is an important mineral that our body needs. We get magnesium from some foods that we eat, especially foods that are high in fiber  such as broccoli, almonds and whole grains. There are also magnesium-based medications used to treat constipation including milk of magnesia (magnesium hydroxide), magnesium citrate and magnesium oxide. They work by drawing water into the intestine, putting it into the class of "osmotic" laxatives. Magnesium products in low doses appear to be safe, but if taken in very large doses, can lead to problems such as irregular heartbeat, low blood pressure and even death. It can also affect other medications you might be taking, therefore it is important to discuss using magnesium with your physician and pharmacist before initiating therapy. Most over-the-counter magnesium laxatives work very well to help with the constipation related to surgery, but sometimes they work too well and lead to diarrhea. Make sure you are somewhere with easy access to a bathroom, just in case.   Bisacodyl: Bisacodyl (generic name) is sold under brand names such as Dulcolax. Much like senna, it is a "stimulant laxative," meaning it makes your intestines move more quickly to push out the stool. This is another good choice to start taking as soon as your doctor says you can take a laxative after surgery. It comes in pill form and as a suppository, which is a good choice for people who cannot or are not allowed to swallow pills. Studies have shown that it works as a laxative, but like most of these medications, you should use this on a short-term basis only.   Enema: Enemas strike fear in many people, but FEAR NOT! It's nowhere near as big a deal as you may think. An enema is just a way to get some liquid into your rectum by placing a specially designed device through your anus. If you have never done one, it might seem like a painful, unpleasant, uncomfortable, complicated and lengthy procedure. But in reality, it's simple, takes just a few seconds and is highly effective. The small ready-made bottles you buy at the pharmacy are much easier  than the hose/large rubber container type. Those recommended positions illustrated in some instructions are generally not necessary to place the enema. It's very similar to the insertion of a tampon, requiring a slight squat. Some extra lubrication on the enema's tip (or on your anus) will make it a breeze. In certain cases, there is no substitute for a good enema. For example, if someone has not pooped for a few days, the beginning of the poop waiting to come out can become rock hard. Passing that hard stool can lead to much pain and problems like anal fissures. Inserting  a little liquid to break up the rock-hard stool will help make its passage much easier. Enemas come with different liquids. Most come with saline, but there are also mineral oil options. You can also use warm water in the reusable enema containers. They all work. But since saline can sometimes be irritating, so try a mineral oil or water enema instead.  Here are commonly recommended constipation medications that do not work well for post-surgery constipation: Docusate: Docusate (generic name) most commonly referred to as Colace (brand name) is not really a laxative, but is classified as a stool softener. Although this medication is commonly prescribed, it is not recommended for several reasons: 1) there is no good medical evidence that it works 2) even if it has an effect, which is very questionable, it is minimal and cannot combat the intestinal slowing caused by the opioid medications. Skip docusate to save money and space in your pillbox for something more effective.  PEG: Miralax (brand name) is basically a chemical called polyethylene glycol (PEG) and it has gained tremendous popularity as a laxative. This product is an "osmotic laxative" meaning it works by pulling water into the stool, making it softer. This is very similar to the action of natural fiber in foods and supplements. Therefore, the effect seen by this medication is not  immediate, causing a bowel movement in a day or more. Is this medication strong enough to battle the constipation related to having an operation? Maybe for some people not prone to constipation. But for most people, other laxatives are better to prevent constipation after surgery.  AMBULATORY SURGERY  DISCHARGE INSTRUCTIONS   The drugs that you were given will stay in your system until tomorrow so for the next 24 hours you should not:  Drive an automobile Make any legal decisions Drink any alcoholic beverage   You may resume regular meals tomorrow.  Today it is better to start with liquids and gradually work up to solid foods.  You may eat anything you prefer, but it is better to start with liquids, then soup and crackers, and gradually work up to solid foods.   Please notify your doctor immediately if you have any unusual bleeding, trouble breathing, redness and pain at the surgery site, drainage, fever, or pain not relieved by medication.    Additional Instructions:   Please contact your physician with any problems or Same Day Surgery at [phone], Monday through Friday 6 am to 4 pm, or [place] at [facility] number at [phone].

## 2021-12-30 ENCOUNTER — Encounter: Payer: Self-pay | Admitting: Obstetrics and Gynecology

## 2021-12-30 LAB — SURGICAL PATHOLOGY

## 2022-02-03 NOTE — Progress Notes (Signed)
Nicole Yu pain activity a after I went   02/04/2022 3:48 PM   TRULA FREDE [birth year] [id number]  Referring provider: Sofie Hartigan, MD [place],   [id number]  Urological history: 1. Vesicoureteral reflux  -bilateral ureteral reimplantation (1986) -cysto, cystogram, left retrograde w/ diagnostic urs (2019) - No appreciable vesicoureteral reflux with fully distended bladder, 750 cc capacity under anesthesia.  Left retrograde with irregularity and iliacs, otherwise no hydronephrosis with prompt drainage.  Negative diagnostic left ureteroscopy -contrast CT (05/2021) - no hydronephrosis, unchanged right renal atrophy  2. Nephrolithiasis -CTU (2019) - Punctate stones left kidney.  Chief Complaint  Patient presents with   Dysuria    HPI: Nicole Yu is a 41 y.o. female who presents today for burning and stinging when she urinates.    She underwent a LAH/BS on 12/27/2021 for AUB.  She has had burning at the end of Nicole Yu urination and a full bladder feeling.  It is uncomfortable for Nicole Yu to sit.    She was evaluated by Nicole Yu gynecologist and pelvic exam was normal and two urine cultures were negative.  Wet prep was negative for clue cells, trichomonas and yeast.  One UA had 20 RBC's, but this was taken on 01/06/2022.    She continues to experiencing frequency, urgency, pain in the urethral area that she describes as burning, urge incontinence lower back pain suprapubic pain.  Patient denies any modifying or aggravating factors.  Patient denies any gross hematuria, dysuria or suprapubic/flank pain.  Patient denies any fevers, chills, nausea or vomiting.    She also has a remote history of chronic cystitis.  She states Nicole Yu sister has similar issues and responds well to bladder rescue solutions.  UA 6-10 WBC's and moderate bacteria.    PVR 0 mL    PMH: Past Medical History:  Diagnosis Date   Chronic cystitis    Frequent UTI    GERD (gastroesophageal reflux disease)     Heart palpitations    History of peripheral edema    Migraines    Panic attacks    PVC (premature ventricular contraction)     Surgical History: Past Surgical History:  Procedure Laterality Date   BLADDER SURGERY  age 36   CYSTOGRAM N/A 02/17/2018   Procedure: CYSTOGRAM;  Surgeon: Hollice Espy, MD;  Location: [facility];  Service: Urology;  Laterality: N/A;   CYSTOSCOPY N/A 02/17/2018   Procedure: CYSTOSCOPY;  Surgeon: Hollice Espy, MD;  Location: [facility];  Service: Urology;  Laterality: N/A;   CYSTOSCOPY/RETROGRADE/URETEROSCOPY Left 02/17/2018   Procedure: CYSTOSCOPY/RETROGRADE/URETEROSCOPY;  Surgeon: Hollice Espy, MD;  Location: [facility];  Service: Urology;  Laterality: Left;   LAPAROSCOPIC VAGINAL HYSTERECTOMY WITH SALPINGECTOMY Bilateral 12/27/2021   Procedure: LAPAROSCOPIC ASSISTED VAGINAL HYSTERECTOMY WITH SALPINGECTOMY;  Surgeon: Benjaman Kindler, MD;  Location: [facility];  Service: Gynecology;  Laterality: Bilateral;   LUMBAR FUSION     SPINAL FUSION  2011   L4-5   TONSILLECTOMY     TUBAL LIGATION      Home Medications:  Allergies as of 02/04/2022       Reactions   Doxycycline Nausea Only   Mobic [meloxicam]    Tunnel vision   Penicillins Rash, Other (See Comments)   Has patient had a PCN reaction causing immediate rash, facial/tongue/throat swelling, SOB or lightheadedness with hypotension: No Has patient had a PCN reaction causing severe rash involving mucus membranes or skin necrosis: No Has patient had a PCN reaction that required hospitalization: No Has patient had  a PCN reaction occurring within the last 10 years: No If all of the above answers are "NO", then may proceed with Cephalosporin use.   Prednisone Palpitations        Medication List        Accurate as of February 04, 2022  3:48 PM. If you have any questions, ask your nurse or doctor.          aspirin-acetaminophen-caffeine 250-250-65 MG tablet Commonly known as: EXCEDRIN  MIGRAINE Take 2 tablets by mouth every morning.   docusate sodium 100 MG capsule Commonly known as: COLACE Take 1 capsule (100 mg total) by mouth 2 (two) times daily. To keep stools soft   furosemide 20 MG tablet Commonly known as: LASIX Take 10 mg by mouth daily as needed for edema.   gabapentin 800 MG tablet Commonly known as: Neurontin Take 1 tablet (800 mg total) by mouth at bedtime for 14 days. Take nightly for 3 days, then up to 14 days as needed   Gemtesa 75 MG Tabs Generic drug: Vibegron Take 75 mg by mouth daily.   levonorgestrel 20 MCG/24HR IUD Commonly known as: MIRENA 1 each by Intrauterine route once.   Lysine 500 MG Tabs Take 2,000 mg by mouth every morning.   oxyCODONE 5 MG immediate release tablet Commonly known as: Oxy IR/ROXICODONE Take 1 tablet (5 mg total) by mouth every 4 (four) hours as needed for severe pain.   pantoprazole 20 MG tablet Commonly known as: PROTONIX Take 20 mg by mouth every evening.        Allergies:  Allergies  Allergen Reactions   Doxycycline Nausea Only   Mobic [Meloxicam]     Tunnel vision   Penicillins Rash and Other (See Comments)    Has patient had a PCN reaction causing immediate rash, facial/tongue/throat swelling, SOB or lightheadedness with hypotension: No Has patient had a PCN reaction causing severe rash involving mucus membranes or skin necrosis: No Has patient had a PCN reaction that required hospitalization: No Has patient had a PCN reaction occurring within the last 10 years: No If all of the above answers are "NO", then may proceed with Cephalosporin use.    Prednisone Palpitations    Family History: Family History  Problem Relation Age of Onset   Diabetes Father    Hypercholesterolemia Mother    Breast cancer Maternal Aunt        53's late   Breast cancer Paternal Aunt        Great PAT late 87's    Social History:  reports that she has never smoked. She has never used smokeless tobacco. She  reports current alcohol use. She reports that she does not use drugs.  ROS: Pertinent ROS in HPI  Physical Exam: BP 129/85   Pulse 86   Ht '5\' 3"'$  (1.6 m)   Wt 126 lb (57.2 kg)   BMI 22.32 kg/m   Constitutional:  Well nourished. Alert and oriented, No acute distress. HEENT: [place] AT, moist mucus membranes.  Trachea midline Cardiovascular: No clubbing, cyanosis, or edema. Respiratory: Normal respiratory effort, no increased work of breathing. GU: No CVA tenderness.  No bladder fullness or masses. Normal external genitalia, normal pubic hair distribution, no lesions.  Normal urethral meatus, no lesions, no prolapse, no discharge.   No urethral masses, tenderness and/or tenderness. No bladder fullness, tenderness or masses.  Anus and perineum are without rashes or lesions.     Neurologic: Grossly intact, no focal deficits, moving all 4 extremities. Psychiatric:  Normal mood and affect.    Laboratory Data: Lab Results  Component Value Date   WBC 3.9 (L) 12/19/2021   HGB 13.8 12/19/2021   HCT 40.7 12/19/2021   MCV 90.0 12/19/2021   PLT 177 12/19/2021    Lab Results  Component Value Date   CREATININE 0.83 12/19/2021    Urinalysis See Epic and HPI I have reviewed the labs.   Pertinent Imaging:  02/04/22 15:17  Scan Result 35m    Bladder Rescue Solution Instillation  Due to cystitis patient is present today for a Rescue Solution Treatment.  Patient was cleaned and prepped in a sterile fashion with betadine and lidocaine 2% jelly was instilled into the urethra.  A 14 FR catheter was inserted, urine return was noted 20 ml, urine was yellow clear in color.  Instilled a solution consisting of 834mof Sodium Bicarb, 2 ml Lidocaine and 1 ml of Heparin. The catheter was then removed. Patient tolerated well, no complications were noted.   Performed by: SHZara CouncilPA-C and CrEvelina BucyCMA  Assessment & Plan:    1. Dysuria -UA 6-10 WBC's and moderate bacteria -PVR 0 mL   -urine sent for atypical's -rescue solution given today -She will return later week for 2 more installations -She is also given Gemtesa 75 mg, #28 samples  2.  Vesicular ureteral reflux -She has a history of this and is status post ureter reimplantation and has also had some issues with hydronephrosis in the past -We will schedule renal ultrasound at this time for further evaluation  Return for for bladder rescue solution .  These notes generated with voice recognition software. I apologize for typographical errors.  [id number] Glen[address] RoadSuSterrettuCasper MountainNC [phone]

## 2022-02-04 ENCOUNTER — Ambulatory Visit (INDEPENDENT_AMBULATORY_CARE_PROVIDER_SITE_OTHER): Payer: BC Managed Care – PPO | Admitting: Urology

## 2022-02-04 ENCOUNTER — Encounter: Payer: Self-pay | Admitting: Urology

## 2022-02-04 VITALS — BP 129/85 | HR 86 | Ht 63.0 in | Wt 126.0 lb

## 2022-02-04 DIAGNOSIS — N1370 Vesicoureteral-reflux, unspecified: Secondary | ICD-10-CM | POA: Diagnosis not present

## 2022-02-04 DIAGNOSIS — R300 Dysuria: Secondary | ICD-10-CM | POA: Diagnosis not present

## 2022-02-04 LAB — BLADDER SCAN AMB NON-IMAGING

## 2022-02-04 LAB — URINALYSIS, COMPLETE
Bilirubin, UA: NEGATIVE
Glucose, UA: NEGATIVE
Ketones, UA: NEGATIVE
Nitrite, UA: NEGATIVE
Protein,UA: NEGATIVE
Specific Gravity, UA: 1.02 (ref 1.005–1.030)
Urobilinogen, Ur: 0.2 mg/dL (ref 0.2–1.0)
pH, UA: 7 (ref 5.0–7.5)

## 2022-02-04 LAB — MICROSCOPIC EXAMINATION

## 2022-02-04 MED ORDER — GEMTESA 75 MG PO TABS: 75.0000 mg | ORAL_TABLET | Freq: Every day | ORAL | 0 refills | Status: DC

## 2022-02-04 MED ORDER — SODIUM BICARBONATE 8.4 % IV SOLN
11.0000 mL | Freq: Once | INTRAVENOUS | Status: AC
  Administered 2022-02-04: 11 mL

## 2022-02-04 NOTE — Addendum Note (Signed)
Addended by: Evelina Bucy on: 02/04/2022 04:27 PM   Modules accepted: Orders

## 2022-02-06 ENCOUNTER — Ambulatory Visit (INDEPENDENT_AMBULATORY_CARE_PROVIDER_SITE_OTHER): Payer: BC Managed Care – PPO | Admitting: Urology

## 2022-02-06 ENCOUNTER — Ambulatory Visit: Payer: BC Managed Care – PPO | Admitting: Urology

## 2022-02-06 DIAGNOSIS — R300 Dysuria: Secondary | ICD-10-CM | POA: Diagnosis not present

## 2022-02-06 LAB — MICROSCOPIC EXAMINATION

## 2022-02-06 LAB — URINALYSIS, COMPLETE
Bilirubin, UA: NEGATIVE
Glucose, UA: NEGATIVE
Ketones, UA: NEGATIVE
Nitrite, UA: NEGATIVE
Protein,UA: NEGATIVE
RBC, UA: NEGATIVE
Specific Gravity, UA: 1.01 (ref 1.005–1.030)
Urobilinogen, Ur: 0.2 mg/dL (ref 0.2–1.0)
pH, UA: 6.5 (ref 5.0–7.5)

## 2022-02-06 MED ORDER — SODIUM BICARBONATE 8.4 % IV SOLN
11.0000 mL | Freq: Once | INTRAVENOUS | Status: AC
  Administered 2022-02-06: 11 mL

## 2022-02-06 NOTE — Progress Notes (Signed)
Bladder Rescue Solution Instillation  Due to cystitis patient is present today for a Rescue Solution Treatment.  Patient was cleaned and prepped in a sterile fashion with betadine and lidocaine 2% jelly was instilled into the urethra.  A 14 FR catheter was inserted, urine return was noted 0 ml, urine.  Instilled a solution consisting of 29m of Sodium Bicarb, 2 ml Lidocaine and 1 ml of Heparin. The catheter was then removed. Patient tolerated well, no complications were noted.   Performed by: SZara Council PA-C and CEvelina Bucy CMA  Follow up/ Additional Notes: Tomorrow

## 2022-02-07 ENCOUNTER — Other Ambulatory Visit: Payer: Self-pay | Admitting: Urology

## 2022-02-07 ENCOUNTER — Ambulatory Visit (INDEPENDENT_AMBULATORY_CARE_PROVIDER_SITE_OTHER): Payer: BC Managed Care – PPO | Admitting: Urology

## 2022-02-07 DIAGNOSIS — R300 Dysuria: Secondary | ICD-10-CM | POA: Diagnosis not present

## 2022-02-07 LAB — URINALYSIS, COMPLETE
Bilirubin, UA: NEGATIVE
Glucose, UA: NEGATIVE
Ketones, UA: NEGATIVE
Nitrite, UA: NEGATIVE
Protein,UA: NEGATIVE
Specific Gravity, UA: 1.015 (ref 1.005–1.030)
Urobilinogen, Ur: 0.2 mg/dL (ref 0.2–1.0)
pH, UA: 6.5 (ref 5.0–7.5)

## 2022-02-07 LAB — MICROSCOPIC EXAMINATION

## 2022-02-07 MED ORDER — NITROFURANTOIN MONOHYD MACRO 100 MG PO CAPS: 100.0000 mg | ORAL_CAPSULE | Freq: Two times a day (BID) | ORAL | 0 refills | Status: DC

## 2022-02-07 MED ORDER — SODIUM BICARBONATE 8.4 % IV SOLN
11.0000 mL | Freq: Once | INTRAVENOUS | Status: AC
  Administered 2022-02-07: 11 mL

## 2022-02-07 NOTE — Progress Notes (Signed)
Bladder Rescue Solution Instillation  Due to cystits patient is present today for a Rescue Solution Treatment.  Patient was cleaned and prepped in a sterile fashion with betadine and lidocaine 2% jelly was instilled into the urethra.  A 14 FR catheter was inserted, urine return was noted 0 ml. Instilled a solution consisting of 66m of Sodium Bicarb, 2 ml Lidocaine and 1 ml of Heparin. The catheter was then removed. Patient tolerated well, no complications were noted.   Performed by: SZara Council PA-C and CEvelina Bucy CMA   Follow up/ Additional Notes: Today's UA yellow clear, SG 1.015, trace blood, pH 6.5, 2+ Leukocytes and moderate bacteria.  Urine sent for culture.  Started on Macrobid BID x 7 days.  RUS scheduled for 02/11/2022.  She has a $110 co-pay and she had met her deductible for the year, so she would like as much done before January as she can.

## 2022-02-07 NOTE — Addendum Note (Signed)
Addended by: Evelina Bucy on: 02/07/2022 09:57 AM   Modules accepted: Orders

## 2022-02-10 ENCOUNTER — Encounter (INDEPENDENT_AMBULATORY_CARE_PROVIDER_SITE_OTHER): Payer: Self-pay

## 2022-02-11 ENCOUNTER — Ambulatory Visit
Admission: RE | Admit: 2022-02-11 | Discharge: 2022-02-11 | Disposition: A | Discharge status: Home / Self Care | Payer: BC Managed Care – PPO | Source: Ambulatory Visit | Attending: Urology | Admitting: Urology

## 2022-02-11 DIAGNOSIS — N1370 Vesicoureteral-reflux, unspecified: Secondary | ICD-10-CM | POA: Insufficient documentation

## 2022-02-11 LAB — MYCOPLASMA / UREAPLASMA CULTURE
Mycoplasma hominis Culture: NEGATIVE
Ureaplasma urealyticum: NEGATIVE

## 2022-02-11 LAB — CULTURE, URINE COMPREHENSIVE

## 2022-02-13 ENCOUNTER — Telehealth: Payer: Self-pay

## 2022-02-13 NOTE — Telephone Encounter (Signed)
Attempted to reach pt, phone number not working. Sent pt mychart msg, see separate encounter.

## 2022-02-13 NOTE — Telephone Encounter (Signed)
-----   Message from Nori Riis, PA-C sent at 02/13/2022 12:03 PM EDT ----- Please let Nicole Yu know that the renal ultrasound shows her kidneys are stable and not contributing to her symptoms.  We need to get her scheduled with Dr. Erlene Quan for a cystoscopy to look inside her bladder to see what may be going on.

## 2022-02-14 ENCOUNTER — Ambulatory Visit (INDEPENDENT_AMBULATORY_CARE_PROVIDER_SITE_OTHER): Payer: BC Managed Care – PPO | Admitting: Urology

## 2022-02-14 ENCOUNTER — Other Ambulatory Visit
Admission: RE | Admit: 2022-02-14 | Discharge: 2022-02-14 | Disposition: A | Discharge status: Home / Self Care | Payer: BC Managed Care – PPO | Attending: Urology | Admitting: Urology

## 2022-02-14 ENCOUNTER — Other Ambulatory Visit: Payer: Self-pay

## 2022-02-14 ENCOUNTER — Encounter: Payer: Self-pay | Admitting: Urology

## 2022-02-14 VITALS — BP 112/80 | HR 99 | Ht 63.0 in | Wt 126.0 lb

## 2022-02-14 DIAGNOSIS — K219 Gastro-esophageal reflux disease without esophagitis: Secondary | ICD-10-CM

## 2022-02-14 DIAGNOSIS — R300 Dysuria: Secondary | ICD-10-CM

## 2022-02-14 DIAGNOSIS — N3010 Interstitial cystitis (chronic) without hematuria: Secondary | ICD-10-CM | POA: Diagnosis not present

## 2022-02-14 DIAGNOSIS — N1370 Vesicoureteral-reflux, unspecified: Secondary | ICD-10-CM

## 2022-02-14 LAB — URINALYSIS, COMPLETE (UACMP) WITH MICROSCOPIC
Bilirubin Urine: NEGATIVE
Glucose, UA: 100 mg/dL — AB
Hgb urine dipstick: NEGATIVE
Ketones, ur: NEGATIVE mg/dL
Nitrite: POSITIVE — AB
Protein, ur: NEGATIVE mg/dL
Specific Gravity, Urine: <1.005 — ABNORMAL LOW (ref 1.005–1.030)
pH: 6.5 (ref 5.0–8.0)

## 2022-02-14 MED ORDER — HYDROXYZINE PAMOATE 50 MG PO CAPS: 50.0000 mg | ORAL_CAPSULE | Freq: Every evening | ORAL | 11 refills | Status: AC

## 2022-02-14 MED ORDER — URIBEL 118 MG PO CAPS: 1.0000 | ORAL_CAPSULE | Freq: Four times a day (QID) | ORAL | 1 refills | Status: DC | PRN

## 2022-02-14 NOTE — Progress Notes (Signed)
   02/14/22  CC:  Chief Complaint  Patient presents with   Cysto    HPI: 41 year old female with chronic dysuria who returns today for office cystoscopy.  Notably, she also has a personal history of bilateral ureteral reimplant in 1986, underwent cystogram and diagnostic ureteroscopy in 2019.  She has had a negative CT urogram in 05/2021 with chronic right renal atrophy.  She has tried rescue solution which have not been effective.  She reports today that she believes she is having an IC flare.  She has lower abdominal pressure, pain and discomfort in her bladder area as well as some dysuria in the absence of infection.  This is been worse over the last 6 weeks since having her hysterectomy.  She is also having hot flashes and difficulty sleeping.  She is stressed about this.  She did not have her ovaries removed.  She wonders if her estrogen levels may be affected.  She did try some estrogen cream topically for few days but this seemed to irritate things further.  She has been using Pyridium on a as needed basis which helps but then wears off.  She also has started taking her dogs hydroxyzine for few days to see if that is effective.  There were no vitals taken for this visit. NED. A&Ox3.   No respiratory distress   Abd soft, NT, ND Normal external genitalia with patent urethral meatus  Cystoscopy Procedure Note  Patient identification was confirmed, informed consent was obtained, and patient was prepped using Betadine solution.  Lidocaine jelly was administered per urethral meatus.    Procedure: - Flexible cystoscope introduced, without any difficulty.   - Thorough search of the bladder revealed:    normal urethral meatus    normal urothelium    no stones    no ulcers     no tumors    no urethral polyps    no trabeculation  - Ureteral orifices were laterally displaced consistent with previous reimplant mild trigonitis, subtle  Post-Procedure: - Patient tolerated the  procedure well  Assessment/ Plan:  1. IC (interstitial cystitis) Lengthy discussion about what is likely an IC flare exacerbated by recent surgery  Cystoscopy today, its unremarkable  Her urinalysis is also negative  I have prescribed her hydroxyzine 50 mg nightly to try to see if this is effective.  I also prescribed her Lynnda Shields which she has tried as a child but was unable to tolerate but swelling to try again.  She may also use Pyridium but may not use it more than 2 to 3 days consecutively.  We will have her return in about 3 months with Larene Beach to reassess her urinary symptoms.  2. Vesico-ureteral reflux Ultrasound consistent with this as well as cystoscopy today     Return in about 3 months (around 05/17/2022) for shannon PVR/ UA.  Hollice Espy, MD

## 2022-02-20 ENCOUNTER — Encounter: Payer: Self-pay | Admitting: Urology

## 2022-02-27 ENCOUNTER — Other Ambulatory Visit: Payer: Self-pay

## 2022-02-27 MED ORDER — URIBEL 118 MG PO CAPS: ORAL_CAPSULE | ORAL | 0 refills | Status: AC

## 2022-02-27 MED ORDER — TRIMETHOPRIM 100 MG PO TABS: 100.0000 mg | ORAL_TABLET | Freq: Every day | ORAL | 2 refills | Status: AC

## 2022-02-28 ENCOUNTER — Ambulatory Visit: Payer: BC Managed Care – PPO | Admitting: Urology

## 2022-05-19 ENCOUNTER — Ambulatory Visit: Payer: BC Managed Care – PPO | Admitting: Urology

## 2022-06-08 ENCOUNTER — Telehealth: Payer: Self-pay | Admitting: Urology

## 2022-06-08 NOTE — Telephone Encounter (Signed)
Please call Nicole Yu and have her reschedule her missed appointment on 02/05 to recheck her urine.

## 2022-06-09 NOTE — Telephone Encounter (Signed)
Spoke to patient and she states all of her symptoms were related from her surgery, it has threw her into menopause. She has been following with her GYN.

## 2022-08-27 ENCOUNTER — Other Ambulatory Visit: Payer: Self-pay | Admitting: Urology

## 2022-10-12 IMAGING — US US ABDOMEN LIMITED
1 series · 14 of 25 positions shown · non-contrast
Comparison: None

CLINICAL DATA: RIGHT upper quadrant abdominal pain for 1 week

EXAM:
ULTRASOUND ABDOMEN LIMITED RIGHT UPPER QUADRANT

[Series 1: us abdomen limited ruq (liver/gb) · 14 of 66 slices shown]
[im 1/66]
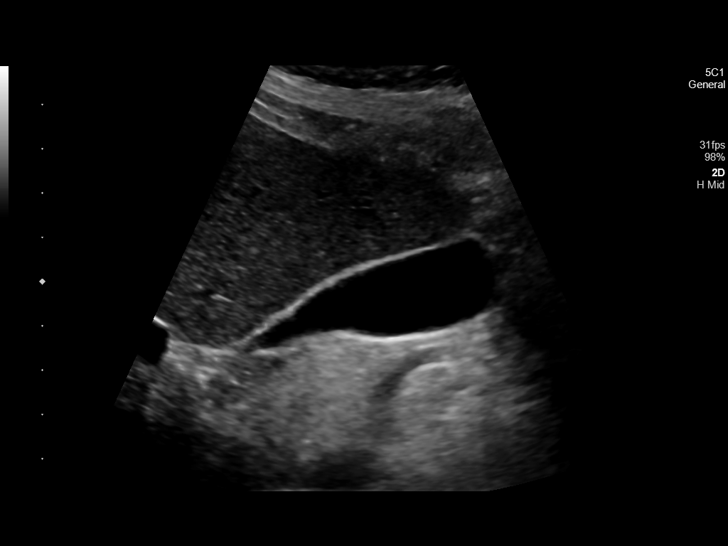
[im 6/66]
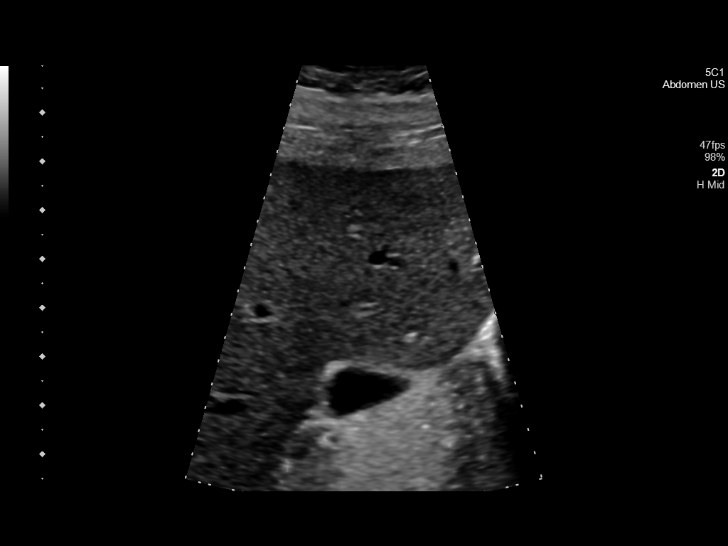
[im 11/66]
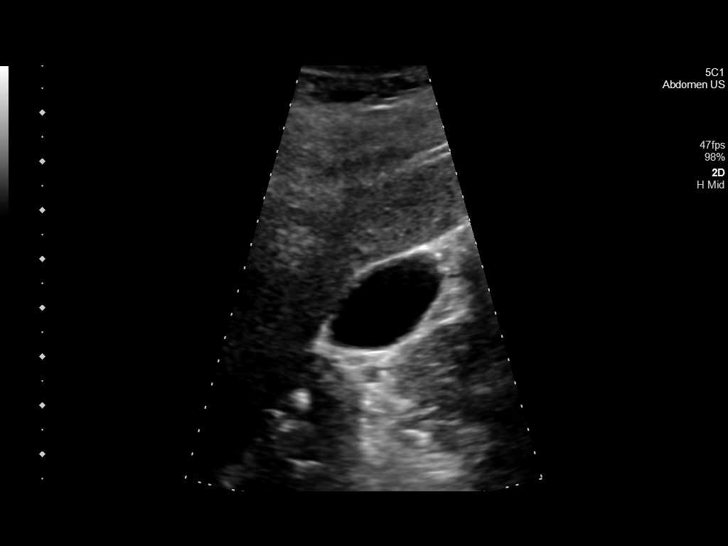
[im 17/66]
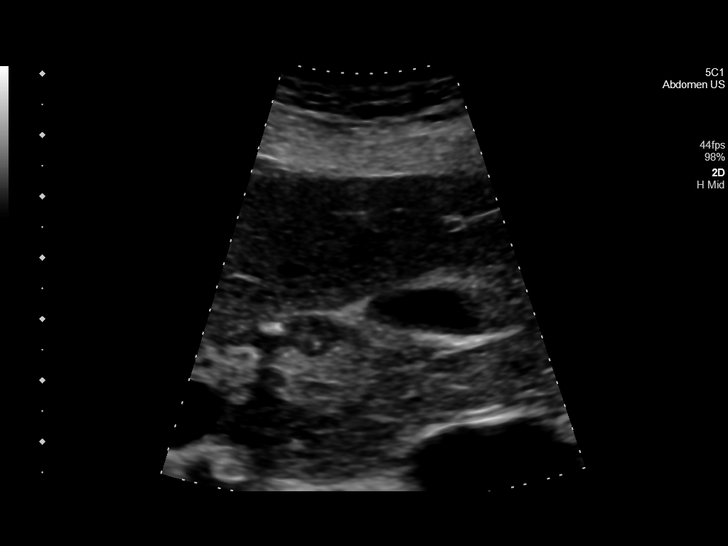
[im 22/66]
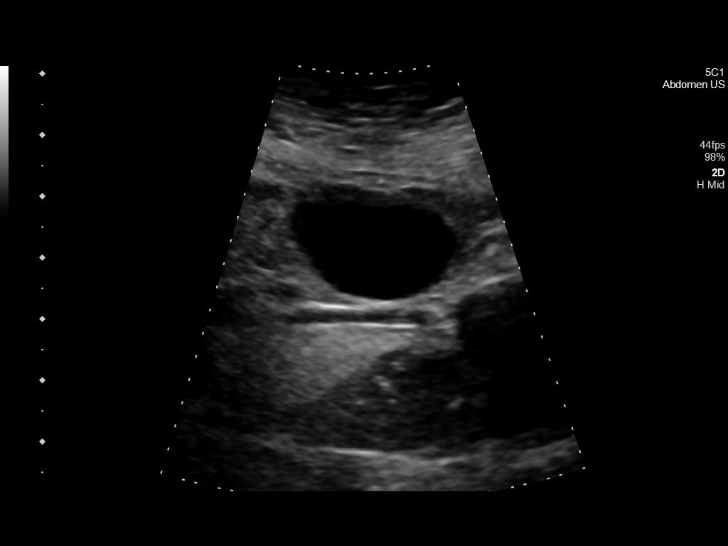
[im 25/66]
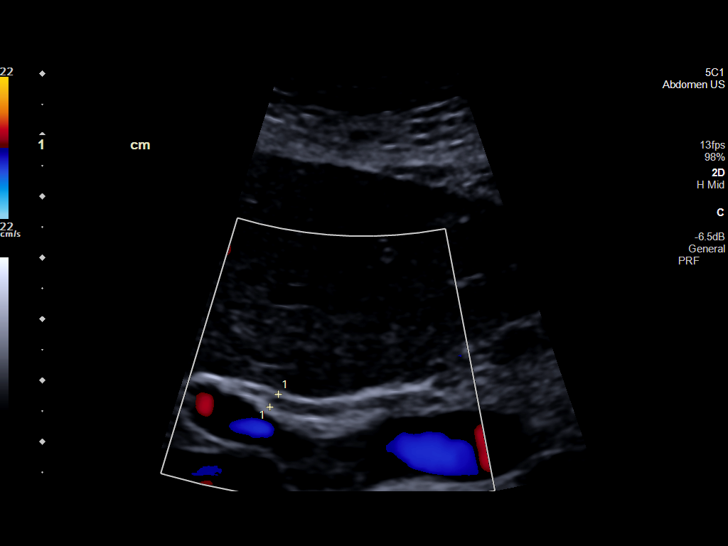
[im 30/66]
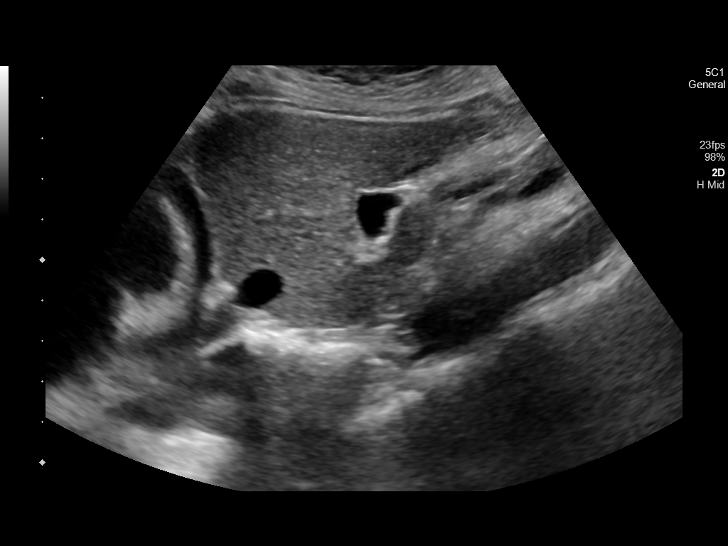
[im 36/66]
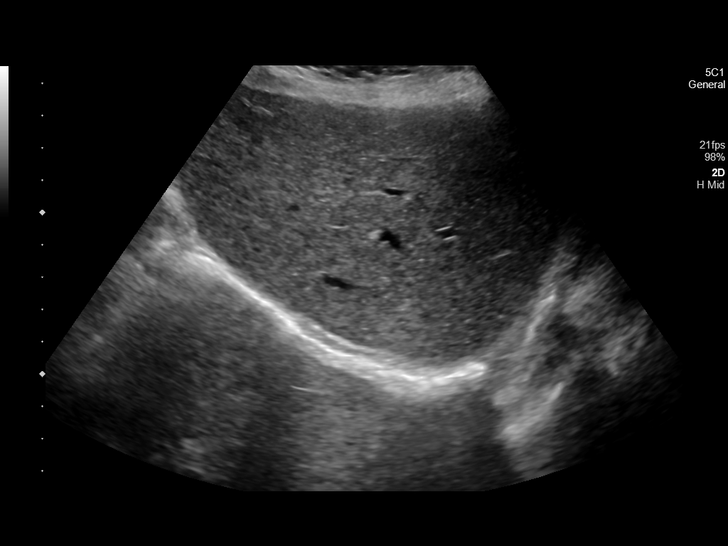
[im 41/66]
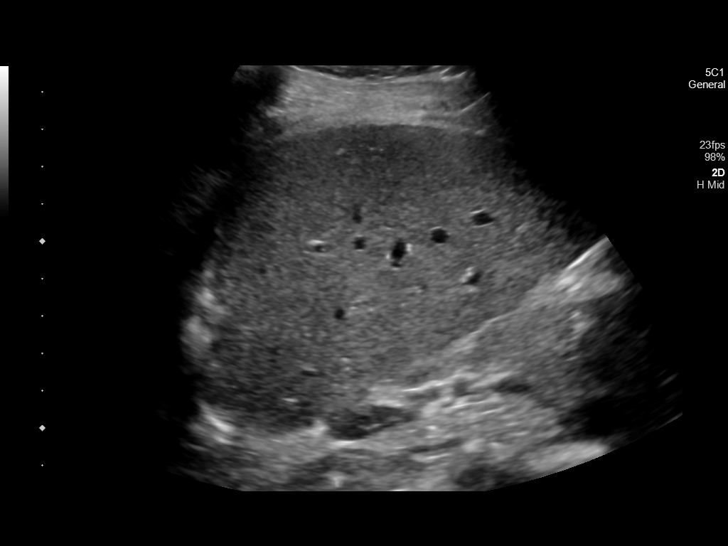
[im 44/66]
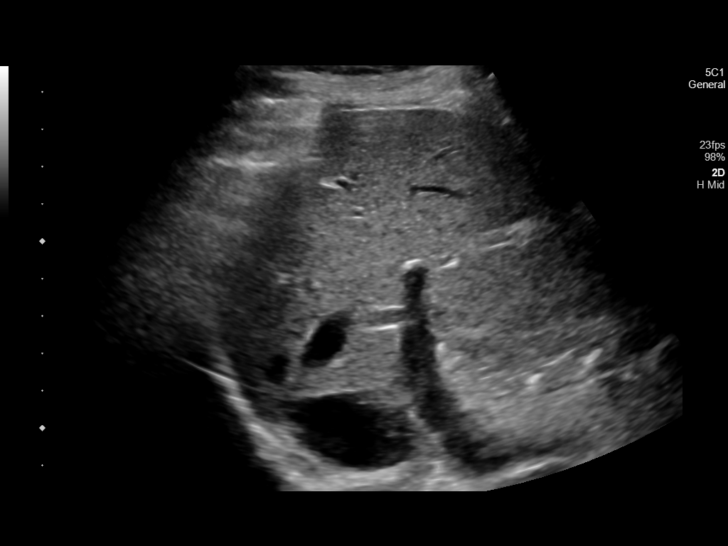
[im 49/66]
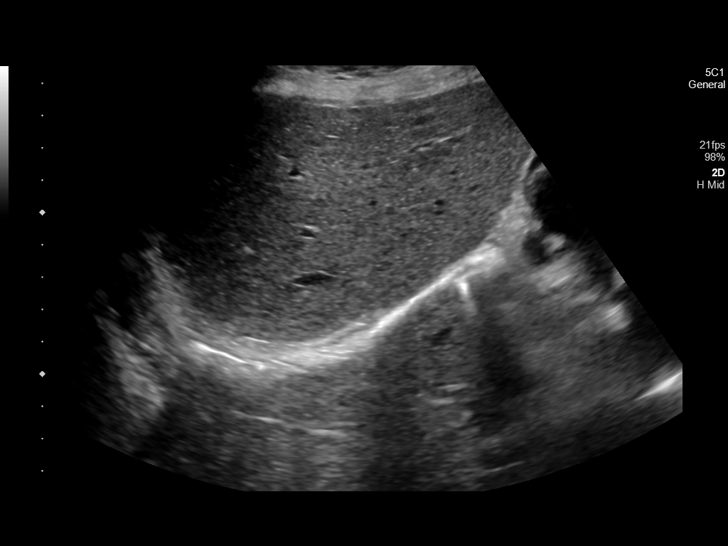
[im 55/66]
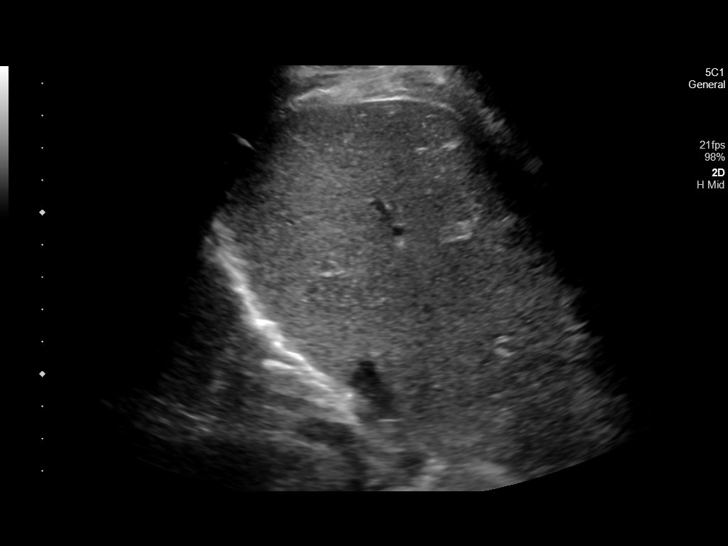
[im 60/66]
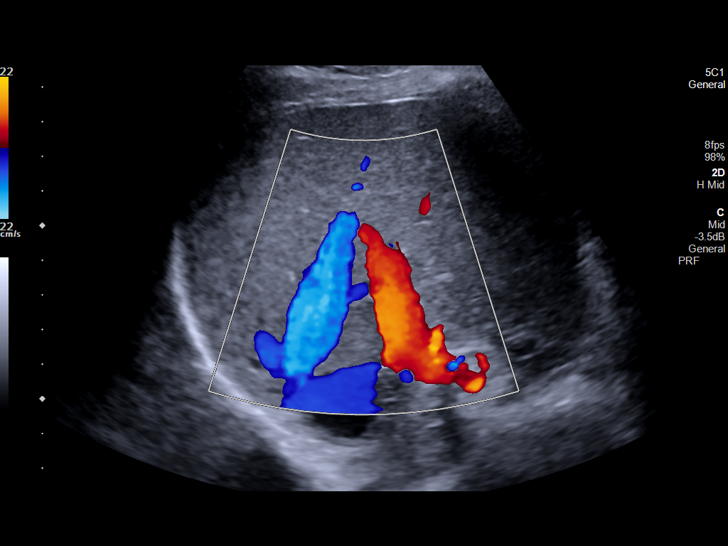
[im 66/66]
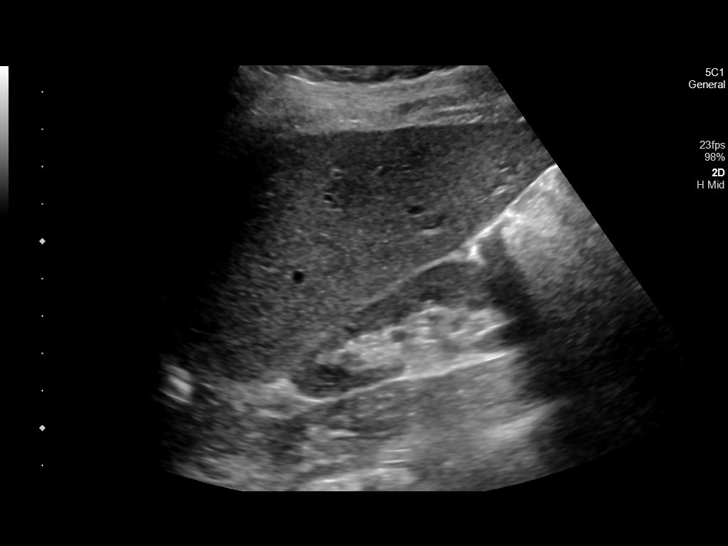

[14 of 25 positions shown; findings below may reference images not displayed]

FINDINGS: Gallbladder:

Normally distended without stones or wall thickening. No
pericholecystic fluid or sonographic Murphy sign.

Common bile duct:

Diameter: 3 mm, normal

Liver:

Normal echogenicity without mass or nodularity. No intrahepatic
biliary dilatation. Portal vein is patent on color Doppler imaging
with normal direction of blood flow towards the liver.

Other: No RIGHT upper quadrant free fluid.
IMPRESSION: Normal exam.

## 2024-02-02 IMAGING — CT CT ABD-PELV W/ CM
2 of 4 series · 16 of 46 positions shown, 18 images · IV contrast (APPLIED)
Comparison: CT January 28, 2018

CLINICAL DATA: Two years of chronic constipation with bloating and
intermittent epigastric pain.

EXAM:
CT ABDOMEN AND PELVIS WITH CONTRAST
TECHNIQUE: Multidetector CT imaging of the abdomen and pelvis was performed
using the standard protocol following bolus administration of
intravenous contrast.

[Series 2: axial (person_name) · axial · 0.72mm/px · z∈[-866,-476]mm · 13 of 86 slices shown, 15 images]
[im 4/86  soft-tissue]
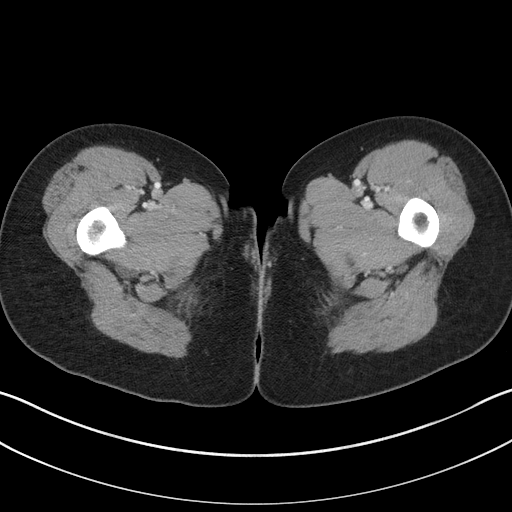
[im 4/86  bone]
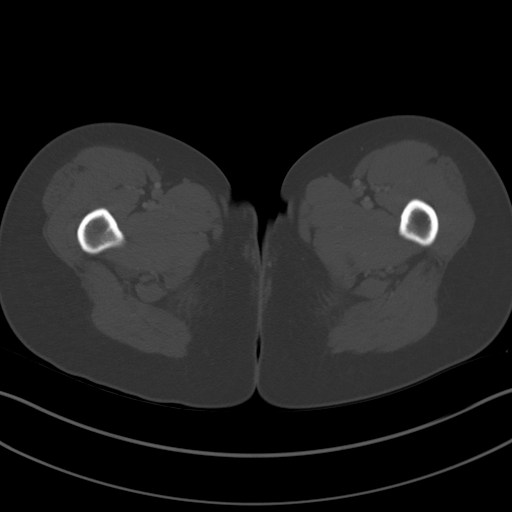
[im 11/86  soft-tissue]
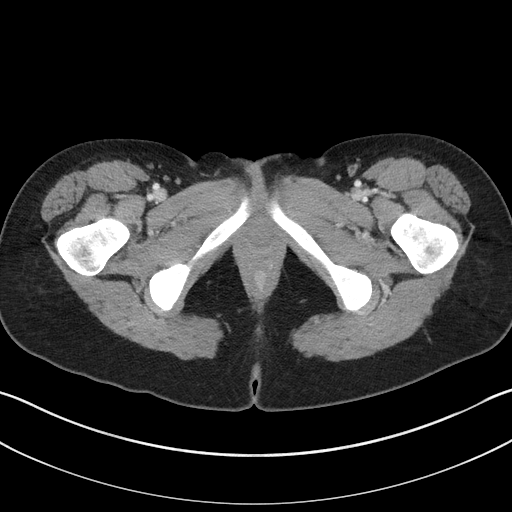
[im 18/86  soft-tissue]
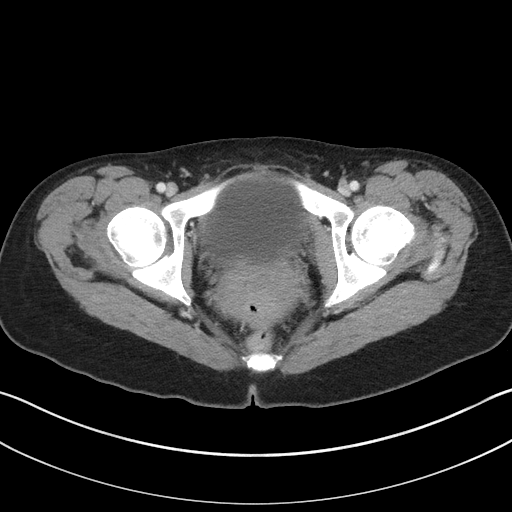
[im 25/86  soft-tissue]
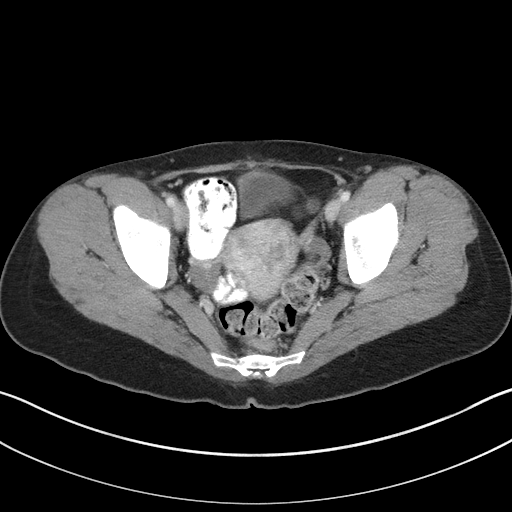
[im 29/86  soft-tissue]
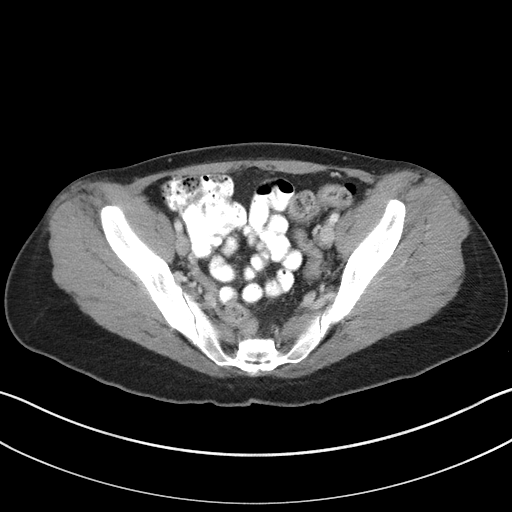
[im 36/86  soft-tissue]
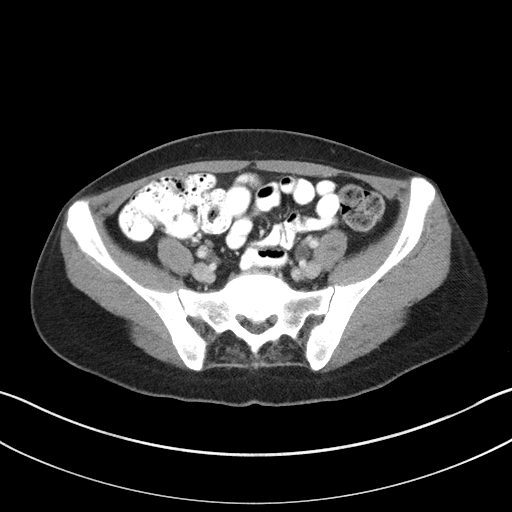
[im 43/86  soft-tissue]
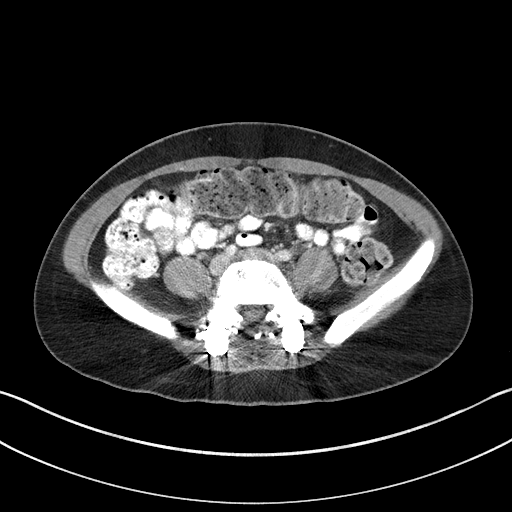
[im 50/86  soft-tissue]
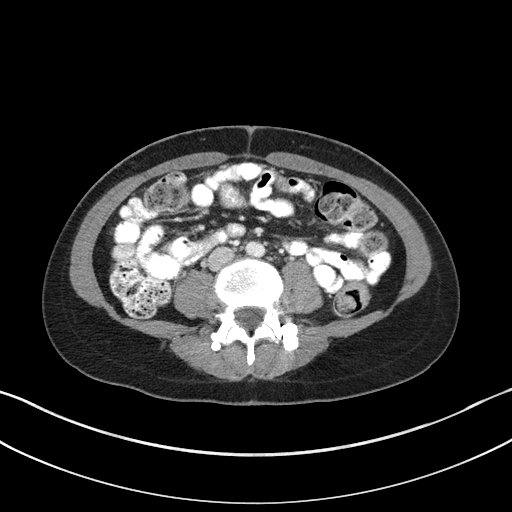
[im 57/86  soft-tissue]
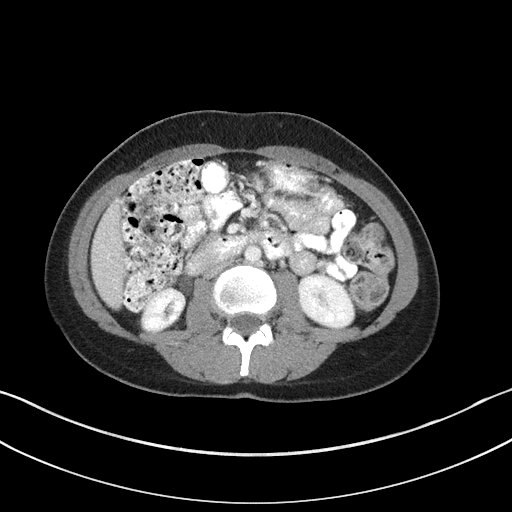
[im 57/86  bone]
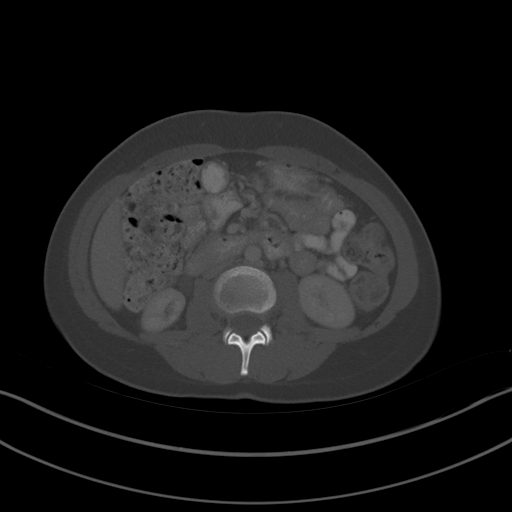
[im 61/86  soft-tissue]
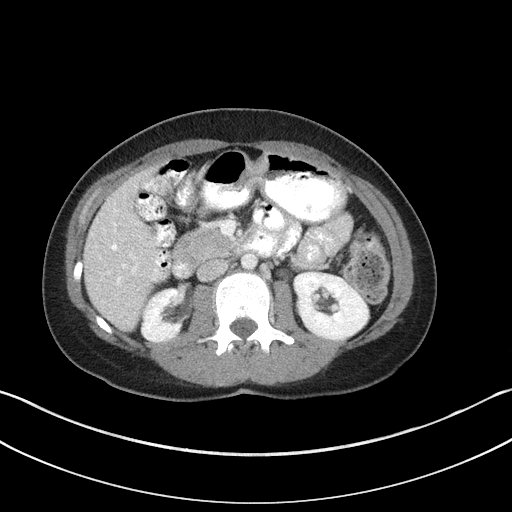
[im 68/86  soft-tissue]
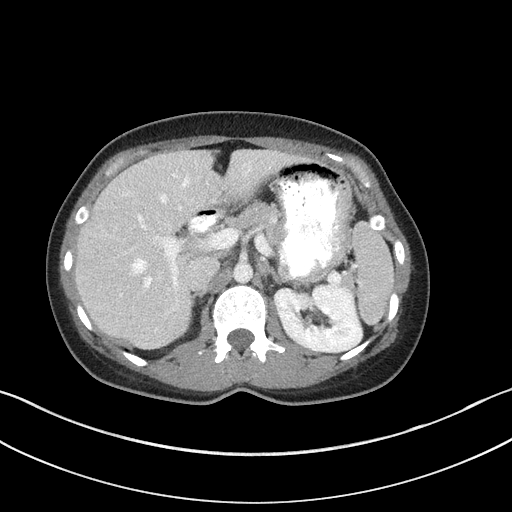
[im 75/86  soft-tissue]
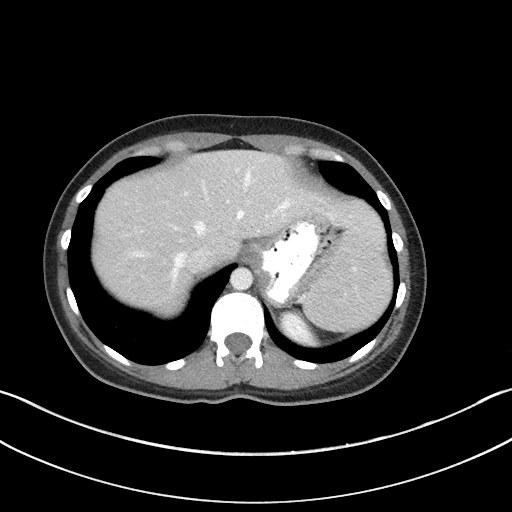
[im 82/86  soft-tissue]
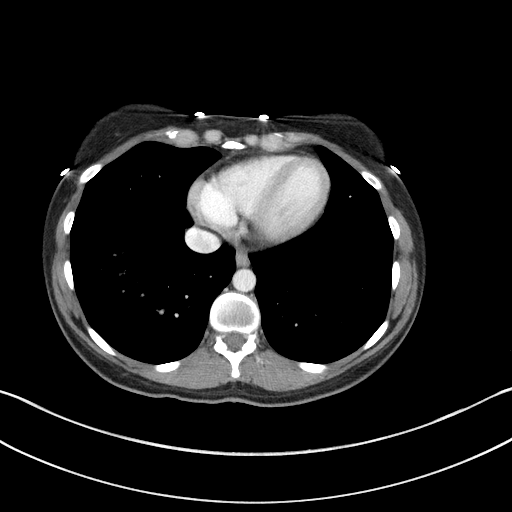

[Series 5: coronal (person_name) · coronal · 0.70mm/px · 3 of 71 slices shown]
[im 24/71  soft-tissue]
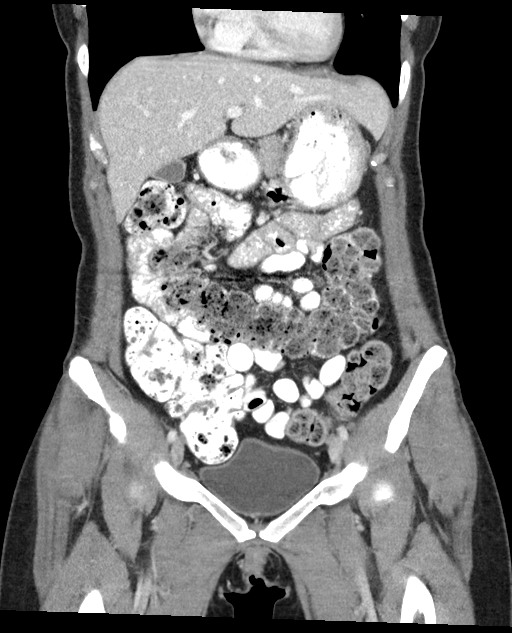
[im 32/71  soft-tissue]
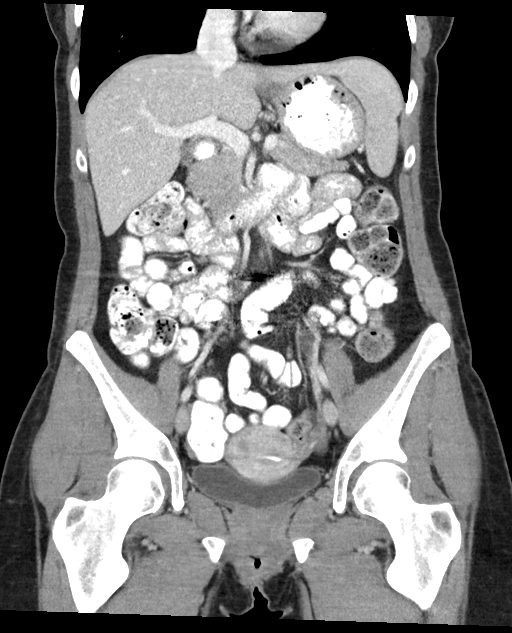
[im 39/71  soft-tissue]
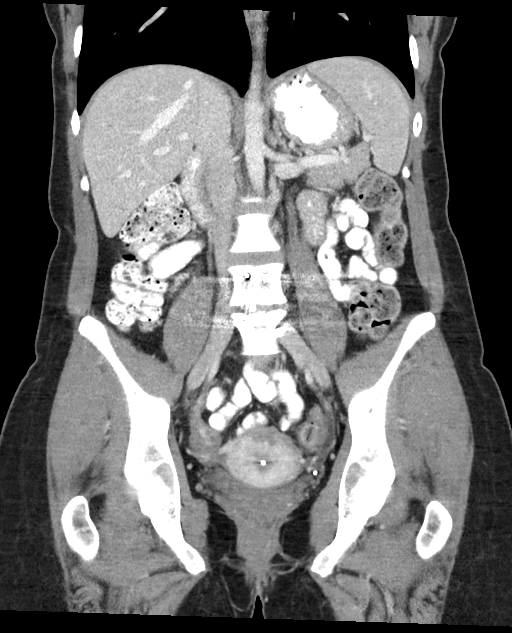

[16 of 46 positions shown; findings below may reference images not displayed]

RADIATION DOSE REDUCTION: This exam was performed according to the
departmental dose-optimization program which includes automated
exposure control, adjustment of the mA and/or kV according to
patient size and/or use of iterative reconstruction technique.

CONTRAST:  80mL OMNIPAQUE IOHEXOL 300 MG/ML  SOLN
FINDINGS: Lower chest: No acute abnormality.

Hepatobiliary: No suspicious hepatic lesion. Gallbladder is
unremarkable. No biliary ductal dilation.

Pancreas: No pancreatic ductal dilation or evidence of acute
inflammation.

Spleen: Normal in size without focal abnormality.

Adrenals/Urinary Tract: Bilateral adrenal glands appear normal. No
hydronephrosis. Kidneys demonstrate symmetric enhancement. Relative
atrophy of the right kidney is unchanged from prior. No solid
enhancing renal mass. Urinary bladder is unremarkable for degree of
distension.

Stomach/Bowel: Radiopaque enteric contrast material traverses the
hepatic flexure. Stomach is unremarkable for degree of distension.
No pathologic dilation of small or large bowel. Terminal ileum
appears normal. The appendix is not confidently identified however
there is no pericecal inflammation. Moderate volume of formed stool
throughout the colon suggestive of constipation. No evidence of
acute bowel inflammation.

Vascular/Lymphatic: Normal caliber abdominal aorta. No
pathologically enlarged abdominal or pelvic lymph nodes.

Reproductive: Intrauterine device appears appropriate in
positioning. No suspicious adnexal mass.

Other: Trace pelvic free fluid within physiologic normal limits.

Musculoskeletal: Prior L4-L5 lumbar spinal fusion with unchanged
fracture of the bilateral L5 pedicle screws. Spondylosis. No acute
osseous abnormality.
IMPRESSION: 1. No acute abnormality of the abdomen or pelvis.
2. Moderate volume of formed stool throughout the colon suggestive
of constipation.
